# Patient Record
Sex: Female | Born: 1935 | Race: White | Hispanic: No | Smoking: Never smoker
Health system: Southern US, Community
[De-identification: ages and names within clinical notes are randomized; demographics above are authoritative.]

## PROBLEM LIST (undated history)

## (undated) DIAGNOSIS — I10 Essential (primary) hypertension: Secondary | ICD-10-CM

## (undated) DIAGNOSIS — M810 Age-related osteoporosis without current pathological fracture: Secondary | ICD-10-CM

## (undated) DIAGNOSIS — E039 Hypothyroidism, unspecified: Secondary | ICD-10-CM

## (undated) DIAGNOSIS — I4891 Unspecified atrial fibrillation: Secondary | ICD-10-CM

## (undated) HISTORY — PX: TOTAL HIP ARTHROPLASTY: SHX124

## (undated) HISTORY — DX: Hypothyroidism, unspecified: E03.9

## (undated) HISTORY — DX: Age-related osteoporosis without current pathological fracture: M81.0

## (undated) HISTORY — PX: TONSILLECTOMY: SUR1361

## (undated) HISTORY — PX: CATARACT EXTRACTION BILATERAL W/ ANTERIOR VITRECTOMY: SHX1304

---

## 2005-12-25 ENCOUNTER — Ambulatory Visit: Payer: Self-pay | Admitting: Family Medicine

## 2005-12-25 DIAGNOSIS — D510 Vitamin B12 deficiency anemia due to intrinsic factor deficiency: Secondary | ICD-10-CM | POA: Insufficient documentation

## 2006-01-19 ENCOUNTER — Ambulatory Visit: Payer: Self-pay | Admitting: Family Medicine

## 2006-01-19 DIAGNOSIS — I10 Essential (primary) hypertension: Secondary | ICD-10-CM

## 2006-01-19 DIAGNOSIS — M19019 Primary osteoarthritis, unspecified shoulder: Secondary | ICD-10-CM | POA: Insufficient documentation

## 2006-01-19 DIAGNOSIS — H1130 Conjunctival hemorrhage, unspecified eye: Secondary | ICD-10-CM | POA: Insufficient documentation

## 2007-08-23 ENCOUNTER — Ambulatory Visit: Payer: Self-pay | Admitting: Family Medicine

## 2007-08-23 DIAGNOSIS — M76899 Other specified enthesopathies of unspecified lower limb, excluding foot: Secondary | ICD-10-CM

## 2007-09-28 ENCOUNTER — Ambulatory Visit: Payer: Self-pay | Admitting: Family Medicine

## 2007-09-28 ENCOUNTER — Encounter: Admission: RE | Admit: 2007-09-28 | Discharge: 2007-09-28 | Payer: Self-pay | Admitting: Family Medicine

## 2007-09-28 DIAGNOSIS — M25539 Pain in unspecified wrist: Secondary | ICD-10-CM | POA: Insufficient documentation

## 2007-09-28 DIAGNOSIS — M79609 Pain in unspecified limb: Secondary | ICD-10-CM

## 2007-09-29 ENCOUNTER — Ambulatory Visit: Payer: Self-pay | Admitting: Family Medicine

## 2008-02-07 ENCOUNTER — Ambulatory Visit: Payer: Self-pay | Admitting: Family Medicine

## 2008-02-07 DIAGNOSIS — J019 Acute sinusitis, unspecified: Secondary | ICD-10-CM

## 2008-02-14 ENCOUNTER — Ambulatory Visit: Payer: Self-pay | Admitting: Family Medicine

## 2013-03-02 DIAGNOSIS — H53009 Unspecified amblyopia, unspecified eye: Secondary | ICD-10-CM | POA: Diagnosis not present

## 2013-03-02 DIAGNOSIS — H40019 Open angle with borderline findings, low risk, unspecified eye: Secondary | ICD-10-CM | POA: Diagnosis not present

## 2013-03-02 DIAGNOSIS — H4420 Degenerative myopia, unspecified eye: Secondary | ICD-10-CM | POA: Diagnosis not present

## 2013-03-02 DIAGNOSIS — H353190 Nonexudative age-related macular degeneration, unspecified eye, stage unspecified: Secondary | ICD-10-CM | POA: Diagnosis not present

## 2013-03-03 DIAGNOSIS — H4420 Degenerative myopia, unspecified eye: Secondary | ICD-10-CM | POA: Diagnosis not present

## 2013-03-03 DIAGNOSIS — H353190 Nonexudative age-related macular degeneration, unspecified eye, stage unspecified: Secondary | ICD-10-CM | POA: Diagnosis not present

## 2013-03-03 DIAGNOSIS — H53009 Unspecified amblyopia, unspecified eye: Secondary | ICD-10-CM | POA: Diagnosis not present

## 2013-03-03 DIAGNOSIS — H40019 Open angle with borderline findings, low risk, unspecified eye: Secondary | ICD-10-CM | POA: Diagnosis not present

## 2013-06-02 DIAGNOSIS — R609 Edema, unspecified: Secondary | ICD-10-CM | POA: Diagnosis not present

## 2013-06-02 DIAGNOSIS — I10 Essential (primary) hypertension: Secondary | ICD-10-CM | POA: Diagnosis not present

## 2013-06-02 DIAGNOSIS — I4891 Unspecified atrial fibrillation: Secondary | ICD-10-CM | POA: Diagnosis not present

## 2013-06-02 DIAGNOSIS — R269 Unspecified abnormalities of gait and mobility: Secondary | ICD-10-CM | POA: Diagnosis not present

## 2013-06-02 DIAGNOSIS — I2789 Other specified pulmonary heart diseases: Secondary | ICD-10-CM | POA: Diagnosis not present

## 2013-06-02 DIAGNOSIS — I872 Venous insufficiency (chronic) (peripheral): Secondary | ICD-10-CM | POA: Diagnosis not present

## 2013-06-14 DIAGNOSIS — H16229 Keratoconjunctivitis sicca, not specified as Sjogren's, unspecified eye: Secondary | ICD-10-CM | POA: Diagnosis not present

## 2013-06-14 DIAGNOSIS — H53419 Scotoma involving central area, unspecified eye: Secondary | ICD-10-CM | POA: Diagnosis not present

## 2013-06-14 DIAGNOSIS — Z961 Presence of intraocular lens: Secondary | ICD-10-CM | POA: Diagnosis not present

## 2013-06-24 DIAGNOSIS — H53419 Scotoma involving central area, unspecified eye: Secondary | ICD-10-CM | POA: Diagnosis not present

## 2013-06-24 DIAGNOSIS — H16229 Keratoconjunctivitis sicca, not specified as Sjogren's, unspecified eye: Secondary | ICD-10-CM | POA: Diagnosis not present

## 2013-07-15 DIAGNOSIS — I4891 Unspecified atrial fibrillation: Secondary | ICD-10-CM | POA: Diagnosis not present

## 2013-07-15 DIAGNOSIS — R21 Rash and other nonspecific skin eruption: Secondary | ICD-10-CM | POA: Diagnosis not present

## 2013-07-15 DIAGNOSIS — L989 Disorder of the skin and subcutaneous tissue, unspecified: Secondary | ICD-10-CM | POA: Diagnosis not present

## 2013-07-15 DIAGNOSIS — M79609 Pain in unspecified limb: Secondary | ICD-10-CM | POA: Diagnosis not present

## 2013-07-15 DIAGNOSIS — R079 Chest pain, unspecified: Secondary | ICD-10-CM | POA: Diagnosis not present

## 2013-07-21 DIAGNOSIS — M7989 Other specified soft tissue disorders: Secondary | ICD-10-CM | POA: Diagnosis not present

## 2013-07-28 DIAGNOSIS — R21 Rash and other nonspecific skin eruption: Secondary | ICD-10-CM | POA: Diagnosis not present

## 2013-07-28 DIAGNOSIS — R609 Edema, unspecified: Secondary | ICD-10-CM | POA: Diagnosis not present

## 2013-07-28 DIAGNOSIS — I872 Venous insufficiency (chronic) (peripheral): Secondary | ICD-10-CM | POA: Diagnosis not present

## 2013-07-28 DIAGNOSIS — B372 Candidiasis of skin and nail: Secondary | ICD-10-CM | POA: Diagnosis not present

## 2013-07-28 DIAGNOSIS — E0789 Other specified disorders of thyroid: Secondary | ICD-10-CM | POA: Diagnosis not present

## 2013-07-28 DIAGNOSIS — E038 Other specified hypothyroidism: Secondary | ICD-10-CM | POA: Diagnosis not present

## 2013-08-02 DIAGNOSIS — L578 Other skin changes due to chronic exposure to nonionizing radiation: Secondary | ICD-10-CM | POA: Diagnosis not present

## 2013-08-02 DIAGNOSIS — L2089 Other atopic dermatitis: Secondary | ICD-10-CM | POA: Diagnosis not present

## 2013-08-02 DIAGNOSIS — L259 Unspecified contact dermatitis, unspecified cause: Secondary | ICD-10-CM | POA: Diagnosis not present

## 2013-08-16 DIAGNOSIS — L259 Unspecified contact dermatitis, unspecified cause: Secondary | ICD-10-CM | POA: Diagnosis not present

## 2013-08-16 DIAGNOSIS — D649 Anemia, unspecified: Secondary | ICD-10-CM | POA: Diagnosis not present

## 2013-08-16 DIAGNOSIS — L578 Other skin changes due to chronic exposure to nonionizing radiation: Secondary | ICD-10-CM | POA: Diagnosis not present

## 2013-08-16 DIAGNOSIS — L2089 Other atopic dermatitis: Secondary | ICD-10-CM | POA: Diagnosis not present

## 2013-11-08 DIAGNOSIS — Z23 Encounter for immunization: Secondary | ICD-10-CM | POA: Diagnosis not present

## 2013-11-11 DIAGNOSIS — H3531 Nonexudative age-related macular degeneration: Secondary | ICD-10-CM | POA: Diagnosis not present

## 2013-11-21 DIAGNOSIS — I272 Other secondary pulmonary hypertension: Secondary | ICD-10-CM | POA: Diagnosis not present

## 2013-11-21 DIAGNOSIS — I10 Essential (primary) hypertension: Secondary | ICD-10-CM | POA: Diagnosis not present

## 2013-11-21 DIAGNOSIS — I872 Venous insufficiency (chronic) (peripheral): Secondary | ICD-10-CM | POA: Diagnosis not present

## 2013-11-21 DIAGNOSIS — I480 Paroxysmal atrial fibrillation: Secondary | ICD-10-CM | POA: Diagnosis not present

## 2013-11-21 DIAGNOSIS — R269 Unspecified abnormalities of gait and mobility: Secondary | ICD-10-CM | POA: Diagnosis not present

## 2013-11-21 DIAGNOSIS — R600 Localized edema: Secondary | ICD-10-CM | POA: Diagnosis not present

## 2013-11-21 DIAGNOSIS — Z23 Encounter for immunization: Secondary | ICD-10-CM | POA: Diagnosis not present

## 2013-11-28 DIAGNOSIS — I480 Paroxysmal atrial fibrillation: Secondary | ICD-10-CM | POA: Diagnosis not present

## 2013-11-28 DIAGNOSIS — R600 Localized edema: Secondary | ICD-10-CM | POA: Diagnosis not present

## 2013-11-28 DIAGNOSIS — I272 Other secondary pulmonary hypertension: Secondary | ICD-10-CM | POA: Diagnosis not present

## 2013-11-28 DIAGNOSIS — I872 Venous insufficiency (chronic) (peripheral): Secondary | ICD-10-CM | POA: Diagnosis not present

## 2013-11-28 DIAGNOSIS — I10 Essential (primary) hypertension: Secondary | ICD-10-CM | POA: Diagnosis not present

## 2013-12-29 DIAGNOSIS — D510 Vitamin B12 deficiency anemia due to intrinsic factor deficiency: Secondary | ICD-10-CM | POA: Diagnosis not present

## 2013-12-29 DIAGNOSIS — Z0000 Encounter for general adult medical examination without abnormal findings: Secondary | ICD-10-CM | POA: Diagnosis not present

## 2013-12-29 DIAGNOSIS — I10 Essential (primary) hypertension: Secondary | ICD-10-CM | POA: Diagnosis not present

## 2013-12-29 DIAGNOSIS — E538 Deficiency of other specified B group vitamins: Secondary | ICD-10-CM | POA: Diagnosis not present

## 2014-06-07 DIAGNOSIS — I10 Essential (primary) hypertension: Secondary | ICD-10-CM | POA: Diagnosis not present

## 2014-06-07 DIAGNOSIS — R269 Unspecified abnormalities of gait and mobility: Secondary | ICD-10-CM | POA: Diagnosis not present

## 2014-06-07 DIAGNOSIS — I872 Venous insufficiency (chronic) (peripheral): Secondary | ICD-10-CM | POA: Diagnosis not present

## 2014-06-07 DIAGNOSIS — I480 Paroxysmal atrial fibrillation: Secondary | ICD-10-CM | POA: Diagnosis not present

## 2014-06-14 DIAGNOSIS — I872 Venous insufficiency (chronic) (peripheral): Secondary | ICD-10-CM | POA: Diagnosis not present

## 2014-06-14 DIAGNOSIS — R269 Unspecified abnormalities of gait and mobility: Secondary | ICD-10-CM | POA: Diagnosis not present

## 2014-06-14 DIAGNOSIS — I480 Paroxysmal atrial fibrillation: Secondary | ICD-10-CM | POA: Diagnosis not present

## 2014-06-14 DIAGNOSIS — I10 Essential (primary) hypertension: Secondary | ICD-10-CM | POA: Diagnosis not present

## 2014-06-14 DIAGNOSIS — I272 Other secondary pulmonary hypertension: Secondary | ICD-10-CM | POA: Diagnosis not present

## 2014-06-29 DIAGNOSIS — S838X1A Sprain of other specified parts of right knee, initial encounter: Secondary | ICD-10-CM | POA: Diagnosis not present

## 2014-06-29 DIAGNOSIS — H53413 Scotoma involving central area, bilateral: Secondary | ICD-10-CM | POA: Diagnosis not present

## 2014-06-29 DIAGNOSIS — H3531 Nonexudative age-related macular degeneration: Secondary | ICD-10-CM | POA: Diagnosis not present

## 2014-07-27 DIAGNOSIS — M25561 Pain in right knee: Secondary | ICD-10-CM | POA: Diagnosis not present

## 2014-07-27 DIAGNOSIS — E538 Deficiency of other specified B group vitamins: Secondary | ICD-10-CM | POA: Diagnosis not present

## 2014-08-31 DIAGNOSIS — M1711 Unilateral primary osteoarthritis, right knee: Secondary | ICD-10-CM | POA: Diagnosis not present

## 2014-08-31 DIAGNOSIS — R2241 Localized swelling, mass and lump, right lower limb: Secondary | ICD-10-CM | POA: Diagnosis not present

## 2014-09-13 DIAGNOSIS — I480 Paroxysmal atrial fibrillation: Secondary | ICD-10-CM | POA: Diagnosis not present

## 2014-09-13 DIAGNOSIS — I872 Venous insufficiency (chronic) (peripheral): Secondary | ICD-10-CM | POA: Diagnosis not present

## 2014-09-13 DIAGNOSIS — I272 Other secondary pulmonary hypertension: Secondary | ICD-10-CM | POA: Diagnosis not present

## 2014-09-13 DIAGNOSIS — I10 Essential (primary) hypertension: Secondary | ICD-10-CM | POA: Diagnosis not present

## 2014-09-13 DIAGNOSIS — R269 Unspecified abnormalities of gait and mobility: Secondary | ICD-10-CM | POA: Diagnosis not present

## 2014-10-05 DIAGNOSIS — M1711 Unilateral primary osteoarthritis, right knee: Secondary | ICD-10-CM | POA: Diagnosis not present

## 2014-10-09 DIAGNOSIS — E039 Hypothyroidism, unspecified: Secondary | ICD-10-CM | POA: Diagnosis not present

## 2014-10-09 DIAGNOSIS — Z23 Encounter for immunization: Secondary | ICD-10-CM | POA: Diagnosis not present

## 2014-10-09 DIAGNOSIS — D510 Vitamin B12 deficiency anemia due to intrinsic factor deficiency: Secondary | ICD-10-CM | POA: Diagnosis not present

## 2014-10-09 DIAGNOSIS — H6123 Impacted cerumen, bilateral: Secondary | ICD-10-CM | POA: Diagnosis not present

## 2014-10-09 DIAGNOSIS — E782 Mixed hyperlipidemia: Secondary | ICD-10-CM | POA: Diagnosis not present

## 2014-10-09 DIAGNOSIS — I10 Essential (primary) hypertension: Secondary | ICD-10-CM | POA: Diagnosis not present

## 2014-10-09 DIAGNOSIS — D508 Other iron deficiency anemias: Secondary | ICD-10-CM | POA: Diagnosis not present

## 2014-10-09 DIAGNOSIS — I482 Chronic atrial fibrillation: Secondary | ICD-10-CM | POA: Diagnosis not present

## 2014-10-31 DIAGNOSIS — M1711 Unilateral primary osteoarthritis, right knee: Secondary | ICD-10-CM | POA: Diagnosis not present

## 2014-11-23 ENCOUNTER — Emergency Department (INDEPENDENT_AMBULATORY_CARE_PROVIDER_SITE_OTHER)
Admission: EM | Admit: 2014-11-23 | Discharge: 2014-11-23 | Disposition: A | Discharge status: Home / Self Care | Payer: Medicare Other | Source: Home / Self Care | Attending: Family Medicine | Admitting: Family Medicine

## 2014-11-23 ENCOUNTER — Encounter: Payer: Self-pay | Admitting: *Deleted

## 2014-11-23 DIAGNOSIS — Z23 Encounter for immunization: Secondary | ICD-10-CM | POA: Diagnosis not present

## 2014-11-23 DIAGNOSIS — W1839XA Other fall on same level, initial encounter: Secondary | ICD-10-CM | POA: Diagnosis not present

## 2014-11-23 DIAGNOSIS — S41111A Laceration without foreign body of right upper arm, initial encounter: Secondary | ICD-10-CM

## 2014-11-23 DIAGNOSIS — W010XXA Fall on same level from slipping, tripping and stumbling without subsequent striking against object, initial encounter: Secondary | ICD-10-CM

## 2014-11-23 HISTORY — DX: Essential (primary) hypertension: I10

## 2014-11-23 HISTORY — DX: Unspecified atrial fibrillation: I48.91

## 2014-11-23 MED ORDER — INFLUENZA VAC SPLIT QUAD 0.5 ML IM SUSY
0.5000 mL | PREFILLED_SYRINGE | Freq: Once | INTRAMUSCULAR | Status: AC
  Administered 2014-11-23: 0.5 mL via INTRAMUSCULAR

## 2014-11-23 MED ORDER — TETANUS-DIPHTH-ACELL PERTUSSIS 5-2.5-18.5 LF-MCG/0.5 IM SUSP
0.5000 mL | Freq: Once | INTRAMUSCULAR | Status: AC
  Administered 2014-11-23: 0.5 mL via INTRAMUSCULAR

## 2014-11-23 NOTE — ED Provider Notes (Signed)
CSN: [id number]     Arrival date & time 11/23/14  1234 History   First MD Initiated Contact with Patient 11/23/14 1251     Chief Complaint  Patient presents with  . Extremity Laceration   (Consider location/radiation/quality/duration/timing/severity/associated sxs/prior Treatment) HPI  Pt is an 81yo female presenting to [facility] with a laceration to her Right forearm that occurred after she tripped and fell at home yesterday morning, hitting it on a wooden chair.  Pt states she normally uses a cane or walker but not for just short distances.  Denies hitting her head or LOC.  Pt reports bleeding resolved with use of light pressure. Pt reports minimal aching pain.  She cleaned the wound at home and applied Neosporin.  Denies any other injuries. She is not on blood thinners but does take 2 "baby aspirin" daily. Pt is accompanied by her friend today who noticed how deep the cut was and recommended she be evaluated today.  Pt is new to the area and has her first appointment with Dr. Madilyn Fireman on 12/04/14.  Past Medical History  Diagnosis Date  . Hypertension   . Atrial fibrillation [facility])    Past Surgical History  Procedure Laterality Date  . Total hip arthroplasty     Family History  Problem Relation Age of Onset  . Stroke Mother   . Heart attack Father    Social History  Substance Use Topics  . Smoking status: Never Smoker   . Smokeless tobacco: Never Used  . Alcohol Use: No   OB History    No data available     Review of Systems  Cardiovascular: Negative for chest pain and palpitations.  Musculoskeletal: Negative for myalgias and back pain.  Skin: Positive for wound. Negative for color change.  Neurological: Negative for dizziness, seizures, syncope, weakness, light-headedness and numbness.    Allergies  Review of patient's allergies indicates no known allergies.  Home Medications   Prior to Admission medications   Medication Sig Start Date End Date Taking? Authorizing  Provider  aspirin 81 MG tablet Take 162 mg by mouth daily.   Yes Historical Provider, MD  diltiazem (TIAZAC) 240 MG 24 hr capsule Take 240 mg by mouth daily.   Yes Historical Provider, MD  diphenoxylate-atropine (LOMOTIL) 2.5-0.025 MG tablet Take by mouth 4 (four) times daily as needed for diarrhea or loose stools.   Yes Historical Provider, MD  furosemide (LASIX) 20 MG tablet Take 20 mg by mouth 2 (two) times daily.   Yes Historical Provider, MD  gabapentin (NEURONTIN) 600 MG tablet Take 600 mg by mouth 3 (three) times daily.   Yes Historical Provider, MD  levothyroxine (SYNTHROID, LEVOTHROID) 137 MCG tablet Take 137 mcg by mouth daily before breakfast.   Yes Historical Provider, MD  Levothyroxine Sodium (L-THYROXINE SODIUM) POWD by Does not apply route.   Yes Historical Provider, MD  losartan (COZAAR) 100 MG tablet Take 100 mg by mouth daily.   Yes Historical Provider, MD  metoprolol (LOPRESSOR) 100 MG tablet Take 100 mg by mouth 2 (two) times daily.   Yes Historical Provider, MD  Multiple Vitamin (MULTIVITAMIN) tablet Take 1 tablet by mouth daily.   Yes Historical Provider, MD  Omega-3 Fatty Acids (FISH OIL) 1200 MG CAPS Take 2,400 mg by mouth.   Yes Historical Provider, MD   Meds Ordered and Administered this Visit   Medications  Influenza vac split quadrivalent PF (FLUARIX) injection 0.5 mL (0.5 mLs Intramuscular Given 11/23/14 1326)  Tdap (BOOSTRIX) injection 0.5 mL (  0.5 mLs Intramuscular Given 11/23/14 1327)    BP 101/65 mmHg  Pulse 68  Temp(Src) 98.1 F (36.7 C) (Oral)  Resp 14  Wt 147 lb (66.679 kg)  SpO2 97% No data found.   Physical Exam  Constitutional: She is oriented to person, place, and time. She appears well-developed and well-nourished.  HENT:  Head: Normocephalic and atraumatic.  Eyes: EOM are normal.  Neck: Normal range of motion.  Cardiovascular: Normal rate.   Pulses:      Radial pulses are 2+ on the right side.  Pulmonary/Chest: Effort normal.    Musculoskeletal: Normal range of motion.  Neurological: She is alert and oriented to person, place, and time.  Right arm: normal sensation  Skin: Skin is warm and dry. Laceration noted.     Psychiatric: She has a normal mood and affect. Her behavior is normal.  Nursing note and vitals reviewed.   ED Course  .Marland KitchenLaceration Repair Date/Time: 11/23/2014 1:36 PM Performed by: Noland Fordyce Authorized by: Theone Murdoch A Consent: Verbal consent obtained. Risks and benefits: risks, benefits and alternatives were discussed Consent given by: patient Patient understanding: patient states understanding of the procedure being performed Site marked: the operative site was marked Required items: required blood products, implants, devices, and special equipment available Patient identity confirmed: verbally with patient Body area: upper extremity Location details: right lower arm Laceration length: 3 cm Foreign bodies: no foreign bodies Tendon involvement: none Nerve involvement: none Vascular damage: no Local anesthetic: lidocaine 2% with epinephrine Anesthetic total: 1 ml Patient sedated: no Preparation: Patient was prepped and draped in the usual sterile fashion. Irrigation solution: saline Irrigation method: syringe Amount of cleaning: standard Debridement: none Degree of undermining: none Skin closure: 4-0 Prolene Number of sutures: 3 Technique: simple Approximation: loose Approximation difficulty: simple Dressing: 4x4 sterile gauze Patient tolerance: Patient tolerated the procedure well with no immediate complications   (including critical care time)  Labs Review Labs Reviewed - No data to display  Imaging Review No results found.     MDM   1. Fall from slip, trip, or stumble, initial encounter   2. Arm laceration, right, initial encounter     Pt presenting to [facility] with laceration to Right forearm following a trip and fall at home yesterday. No other injuries.   Due to depth and size of laceration, suture closer indicated, however, due to age of laceration, it was sutured loosely together with 3 sutures. See above. Pt updated on her Tdap today as well as given her flu vaccine. Pt requested prescription for a walker as she just moved here from [place] and does not have f/u with PCP until 12/04/14.  Hand written prescription for walker provided. Home care instructions for sutured wound care provided. F/u in 10-14 days for suture removal. Pt and her friend verbalized understanding and agreement with tx plan.      Noland Fordyce, PA-C 11/23/14 1351

## 2014-11-23 NOTE — ED Notes (Signed)
Laceration to right forearm yesterday from falling and hitting on a wooden chair. She believes she lost her balance, not using her cane and fell. Cleaned with neosporin @ home. Needs TDaP.

## 2014-12-04 ENCOUNTER — Ambulatory Visit (INDEPENDENT_AMBULATORY_CARE_PROVIDER_SITE_OTHER): Payer: Medicare Other | Admitting: Family Medicine

## 2014-12-04 ENCOUNTER — Other Ambulatory Visit: Payer: Self-pay | Admitting: Family Medicine

## 2014-12-04 ENCOUNTER — Encounter: Payer: Self-pay | Admitting: Family Medicine

## 2014-12-04 VITALS — BP 113/59 | HR 76 | Temp 97.7°F | Wt 148.0 lb

## 2014-12-04 DIAGNOSIS — S51811A Laceration without foreign body of right forearm, initial encounter: Secondary | ICD-10-CM

## 2014-12-04 DIAGNOSIS — M170 Bilateral primary osteoarthritis of knee: Secondary | ICD-10-CM | POA: Insufficient documentation

## 2014-12-04 DIAGNOSIS — K047 Periapical abscess without sinus: Secondary | ICD-10-CM | POA: Diagnosis not present

## 2014-12-04 DIAGNOSIS — I4891 Unspecified atrial fibrillation: Secondary | ICD-10-CM | POA: Diagnosis not present

## 2014-12-04 DIAGNOSIS — D510 Vitamin B12 deficiency anemia due to intrinsic factor deficiency: Secondary | ICD-10-CM | POA: Diagnosis not present

## 2014-12-04 DIAGNOSIS — I10 Essential (primary) hypertension: Secondary | ICD-10-CM

## 2014-12-04 DIAGNOSIS — M1711 Unilateral primary osteoarthritis, right knee: Secondary | ICD-10-CM

## 2014-12-04 DIAGNOSIS — R220 Localized swelling, mass and lump, head: Secondary | ICD-10-CM

## 2014-12-04 MED ORDER — AMOXICILLIN-POT CLAVULANATE 875-125 MG PO TABS: 1.0000 | ORAL_TABLET | Freq: Two times a day (BID) | ORAL | Status: DC

## 2014-12-04 MED ORDER — CYANOCOBALAMIN 1000 MCG/ML IJ SOLN
1000.0000 ug | INTRAMUSCULAR | Status: DC
  Administered 2014-12-04: 1000 ug via INTRAMUSCULAR

## 2014-12-04 NOTE — Progress Notes (Signed)
Subjective:    Patient ID: Tracie Nguyen, female    DOB: [birth year], 81 y.o.   MRN: [id number]  HPI [place] and hit her forearm on 11/23/14.  She reports she falls a lot. Tdap was updated at the [facility].   She is healing well with no complications. She uses a Insurance claims handler.  Sutures need to be removed.    Right jaw and and right lip pain. RAdiates into the ear.  Hard to chew.  Says it is painful but feels numb to the touch.  Still has a few teeth.  Says doesn' tfeel like it is her teeth.   Has a dentist app in about 3 weeks.  No fever, chills, etc.   Right knee pain with OA.  She had knee "shot" a couple of months ago. She was supposed to get 3.  Wants to get these scheduled.    Afib - no swelling or SOB recently. Though family member says she does get winded with activity.  On ASA.    Neuropathy - she is on gabapentin. Works well. She is not sure about the cause of her neuropathy.    Review of Systems  Constitutional: Negative for fever, diaphoresis and unexpected weight change.  HENT: Negative for hearing loss, rhinorrhea and tinnitus.   Eyes: Negative for visual disturbance.  Respiratory: Negative for cough and wheezing.   Cardiovascular: Negative for chest pain and palpitations.  Gastrointestinal: Negative for nausea, vomiting, diarrhea and blood in stool.  Genitourinary: Negative for vaginal bleeding, vaginal discharge and difficulty urinating.  Musculoskeletal: Negative for myalgias and arthralgias.  Skin: Negative for rash.  Neurological: Negative for headaches.  Hematological: Negative for adenopathy. Does not bruise/bleed easily.  Psychiatric/Behavioral: Negative for sleep disturbance and dysphoric mood. The patient is not nervous/anxious.      BP 113/59 mmHg  Pulse 76  Temp(Src) 97.7 F (36.5 C)  Wt 148 lb (67.132 kg)  SpO2 99%    No Known Allergies  Past Medical History  Diagnosis Date  . Hypertension   . Atrial fibrillation [facility])     Past Surgical History   Procedure Laterality Date  . Total hip arthroplasty      Social History   Social History  . Marital Status: Widowed    Spouse Name: N/A  . Number of Children: N/A  . Years of Education: N/A   Occupational History  . Not on file.   Social History Main Topics  . Smoking status: Never Smoker   . Smokeless tobacco: Never Used  . Alcohol Use: No  . Drug Use: No  . Sexual Activity: Not on file   Other Topics Concern  . Not on file   Social History Narrative    Family History  Problem Relation Age of Onset  . Stroke Mother   . Heart attack Father     Outpatient Encounter Prescriptions as of 12/04/2014  Medication Sig  . amoxicillin-clavulanate (AUGMENTIN) 875-125 MG tablet Take 1 tablet by mouth 2 (two) times daily.  Marland Kitchen aspirin 81 MG tablet Take 162 mg by mouth daily.  Marland Kitchen diltiazem (TIAZAC) 240 MG 24 hr capsule Take 240 mg by mouth daily. Take 1/2 tablet daily  . diphenoxylate-atropine (LOMOTIL) 2.5-0.025 MG tablet Take by mouth 4 (four) times daily as needed for diarrhea or loose stools.  . furosemide (LASIX) 20 MG tablet Take 20 mg by mouth 2 (two) times daily.  Marland Kitchen gabapentin (NEURONTIN) 600 MG tablet Take 600 mg by mouth 3 (three) times daily.  Marland Kitchen  levothyroxine (SYNTHROID, LEVOTHROID) 137 MCG tablet Take 137 mcg by mouth daily before breakfast.  . losartan (COZAAR) 100 MG tablet Take 100 mg by mouth daily.  . metoprolol (LOPRESSOR) 100 MG tablet Take 100 mg by mouth 2 (two) times daily.  . Multiple Vitamin (MULTIVITAMIN) tablet Take 2 tablets by mouth daily.   . Omega-3 Fatty Acids (FISH OIL) 1200 MG CAPS Take 2,400 mg by mouth.  . [DISCONTINUED] Levothyroxine Sodium (L-THYROXINE SODIUM) POWD by Does not apply route.   Facility-Administered Encounter Medications as of 12/04/2014  Medication  . cyanocobalamin ((VITAMIN B-12)) injection 1,000 mcg          Objective:   Physical Exam  Constitutional: She is oriented to person, place, and time. She appears  well-developed and well-nourished.  HENT:  Head: Normocephalic and atraumatic.  Mouth/Throat: Oropharynx is clear and moist.  Tender along the right jaw line.  Lower gum lin on the right lower jaw is very tender and there is some pus around the molar that is present on the right lower jaw line.  Some mild facial swelling over the cheek on the right.   Eyes: Conjunctivae and EOM are normal. Pupils are equal, round, and reactive to light.  Neck: Neck supple. No thyromegaly present.  Small swollen approx 0.5 cm submandibular LN. Tender to palpation.   Cardiovascular: Normal rate and normal heart sounds.   + irregular rhythm.    Pulmonary/Chest: Effort normal and breath sounds normal.  Lymphadenopathy:    She has cervical adenopathy.  Neurological: She is alert and oriented to person, place, and time.  Skin: Skin is warm and dry.  Psychiatric: She has a normal mood and affect. Her behavior is normal.          Assessment & Plan:  Dental abscess - Discussed dx. Will start Augment. appt is in 2 weeks but encouraged her to call to get in sooner. Call if pain worsens, swelling worsens or develops fever, chills etc.  Right knee OA - will refer to sport med for possible Synvisc injection.  Not sure exactly which series she started.    Laceration to right forearm - sutures removed easily.  Patient tolerated well. Healing well.    Afib- asymptomatic.  On ASA.     LE edema - On lasix daily. Will check BMP.

## 2014-12-05 ENCOUNTER — Encounter: Payer: Self-pay | Admitting: Family Medicine

## 2014-12-05 LAB — COMPLETE METABOLIC PANEL WITH GFR
ALT: 12 U/L (ref 6–29)
AST: 15 U/L (ref 10–35)
Albumin: 3.9 g/dL (ref 3.6–5.1)
Alkaline Phosphatase: 63 U/L (ref 33–130)
BUN: 41 mg/dL — AB (ref 7–25)
CALCIUM: 9.4 mg/dL (ref 8.6–10.4)
CHLORIDE: 98 mmol/L (ref 98–110)
CO2: 26 mmol/L (ref 20–31)
CREATININE: 1.74 mg/dL — AB (ref 0.60–0.88)
GFR, Est African American: 30 mL/min — ABNORMAL LOW (ref >=60)
GFR, Est Non African American: 26 mL/min — ABNORMAL LOW (ref >=60)
Glucose, Bld: 95 mg/dL (ref 65–99)
POTASSIUM: 4.3 mmol/L (ref 3.5–5.3)
Sodium: 140 mmol/L (ref 135–146)
Total Bilirubin: 0.6 mg/dL (ref 0.2–1.2)
Total Protein: 7.4 g/dL (ref 6.1–8.1)

## 2014-12-05 LAB — CBC WITH DIFFERENTIAL/PLATELET
BASOS ABS: 0 10*3/uL (ref 0.0–0.1)
Basophils Relative: 0 % (ref 0–1)
Eosinophils Absolute: 0.4 10*3/uL (ref 0.0–0.7)
Eosinophils Relative: 3 % (ref 0–5)
HEMATOCRIT: 35 % — AB (ref 36.0–46.0)
HEMOGLOBIN: 11.3 g/dL — AB (ref 12.0–15.0)
LYMPHS ABS: 2.9 10*3/uL (ref 0.7–4.0)
LYMPHS PCT: 20 % (ref 12–46)
MCH: 28.1 pg (ref 26.0–34.0)
MCHC: 32.3 g/dL (ref 30.0–36.0)
MCV: 87.1 fL (ref 78.0–100.0)
MONO ABS: 1.5 10*3/uL — AB (ref 0.1–1.0)
MPV: 10.7 fL (ref 8.6–12.4)
Monocytes Relative: 10 % (ref 3–12)
NEUTROS ABS: 9.7 10*3/uL — AB (ref 1.7–7.7)
Neutrophils Relative %: 67 % (ref 43–77)
Platelets: 357 10*3/uL (ref 150–400)
RBC: 4.02 MIL/uL (ref 3.87–5.11)
RDW: 16.3 % — ABNORMAL HIGH (ref 11.5–15.5)
WBC: 14.5 10*3/uL — AB (ref 4.0–10.5)

## 2014-12-06 ENCOUNTER — Other Ambulatory Visit: Payer: Self-pay | Admitting: *Deleted

## 2014-12-06 DIAGNOSIS — R7989 Other specified abnormal findings of blood chemistry: Secondary | ICD-10-CM

## 2014-12-06 LAB — FERRITIN: Ferritin: 106 ng/mL (ref 10–291)

## 2014-12-16 ENCOUNTER — Encounter: Payer: Self-pay | Admitting: Family Medicine

## 2014-12-18 ENCOUNTER — Other Ambulatory Visit: Payer: Self-pay

## 2014-12-18 ENCOUNTER — Encounter: Payer: Self-pay | Admitting: Family Medicine

## 2014-12-18 DIAGNOSIS — E785 Hyperlipidemia, unspecified: Secondary | ICD-10-CM | POA: Insufficient documentation

## 2014-12-18 MED ORDER — LEVOTHYROXINE SODIUM 137 MCG PO TABS: 137.0000 ug | ORAL_TABLET | Freq: Every day | ORAL | Status: DC

## 2014-12-18 MED ORDER — FUROSEMIDE 20 MG PO TABS: 20.0000 mg | ORAL_TABLET | Freq: Two times a day (BID) | ORAL | Status: DC

## 2014-12-18 MED ORDER — LOSARTAN POTASSIUM 100 MG PO TABS: 100.0000 mg | ORAL_TABLET | Freq: Every day | ORAL | Status: DC

## 2014-12-21 ENCOUNTER — Telehealth: Payer: Self-pay | Admitting: *Deleted

## 2014-12-21 DIAGNOSIS — R748 Abnormal levels of other serum enzymes: Secondary | ICD-10-CM | POA: Diagnosis not present

## 2014-12-21 DIAGNOSIS — R7989 Other specified abnormal findings of blood chemistry: Secondary | ICD-10-CM | POA: Diagnosis not present

## 2014-12-21 NOTE — Telephone Encounter (Signed)
lvm informing pt's daughter that form is up front for p/u.Audelia [place]

## 2014-12-22 ENCOUNTER — Encounter: Payer: Self-pay | Admitting: Family Medicine

## 2014-12-22 DIAGNOSIS — N183 Chronic kidney disease, stage 3 unspecified: Secondary | ICD-10-CM | POA: Insufficient documentation

## 2014-12-22 LAB — BASIC METABOLIC PANEL WITH GFR
BUN: 31 mg/dL — AB (ref 7–25)
CHLORIDE: 106 mmol/L (ref 98–110)
CO2: 25 mmol/L (ref 20–31)
CREATININE: 1.33 mg/dL — AB (ref 0.60–0.88)
Calcium: 9.2 mg/dL (ref 8.6–10.4)
GFR, Est African American: 41 mL/min — ABNORMAL LOW (ref >=60)
GFR, Est Non African American: 36 mL/min — ABNORMAL LOW (ref >=60)
GLUCOSE: 95 mg/dL (ref 65–99)
POTASSIUM: 5 mmol/L (ref 3.5–5.3)
Sodium: 139 mmol/L (ref 135–146)

## 2015-01-03 ENCOUNTER — Encounter: Payer: Self-pay | Admitting: Family Medicine

## 2015-01-03 ENCOUNTER — Ambulatory Visit (INDEPENDENT_AMBULATORY_CARE_PROVIDER_SITE_OTHER): Payer: Medicare Other | Admitting: Family Medicine

## 2015-01-03 VITALS — BP 144/82 | HR 79 | Temp 97.6°F | Resp 18 | Wt 148.9 lb

## 2015-01-03 DIAGNOSIS — E039 Hypothyroidism, unspecified: Secondary | ICD-10-CM

## 2015-01-03 DIAGNOSIS — H5440 Blindness, one eye, unspecified eye: Secondary | ICD-10-CM | POA: Insufficient documentation

## 2015-01-03 DIAGNOSIS — I10 Essential (primary) hypertension: Secondary | ICD-10-CM

## 2015-01-03 DIAGNOSIS — H5442 Blindness, left eye, normal vision right eye: Secondary | ICD-10-CM

## 2015-01-03 DIAGNOSIS — Z0000 Encounter for general adult medical examination without abnormal findings: Secondary | ICD-10-CM | POA: Diagnosis not present

## 2015-01-03 DIAGNOSIS — N183 Chronic kidney disease, stage 3 unspecified: Secondary | ICD-10-CM

## 2015-01-03 DIAGNOSIS — G609 Hereditary and idiopathic neuropathy, unspecified: Secondary | ICD-10-CM

## 2015-01-03 MED ORDER — FUROSEMIDE 20 MG PO TABS: 20.0000 mg | ORAL_TABLET | ORAL | Status: DC

## 2015-01-03 NOTE — Addendum Note (Signed)
Addended by: Beatrice Lecher D on: 01/03/2015 01:30 PM   Modules accepted: Orders, Medications

## 2015-01-03 NOTE — Progress Notes (Deleted)
Subjective:    Patient ID: Tracie Nguyen, female    DOB: Apr 05, 1925, 81 y.o.   MRN: [id number]  HPI Tracie Nguyen is here today for surgical clearance for dental surgery. She is having surgical removal of teeth for 316, 19, 21-27, 30, 32 and removal of the tori. She is scheduled for surgery on December 21. She has a history of atrial fibrillation, hypothyroidism, hypertension and chronic kidney disease. She is only on aspirin for her atrial fibrillation and takes metoprolol for rate control.  No problems with anesthesia in the past.   No CP or SOB.   She is able to walk about 100 feet before stopping to rest.   No swallowing problems.    Review of Systems   BP 144/82 mmHg  Pulse 79  Temp(Src) 97.6 F (36.4 C)  Resp 18  Wt 148 lb 14.4 oz (67.541 kg)  SpO2 97%    No Known Allergies  Past Medical History  Diagnosis Date  . Hypertension   . Atrial fibrillation [facility])     Past Surgical History  Procedure Laterality Date  . Total hip arthroplasty      Social History   Social History  . Marital Status: Widowed    Spouse Name: N/A  . Number of Children: N/A  . Years of Education: N/A   Occupational History  . Not on file.   Social History Main Topics  . Smoking status: Never Smoker   . Smokeless tobacco: Never Used  . Alcohol Use: No  . Drug Use: No  . Sexual Activity: Not on file   Other Topics Concern  . Not on file   Social History Narrative   Moved here form [place], [place] to be close to family     Family History  Problem Relation Age of Onset  . Stroke Mother   . Heart attack Father     Outpatient Encounter Prescriptions as of 01/03/2015  Medication Sig  . aspirin 81 MG tablet Take 162 mg by mouth daily.  Marland Kitchen diltiazem (TIAZAC) 240 MG 24 hr capsule Take 240 mg by mouth daily. Take 1/2 tablet daily  . diphenoxylate-atropine (LOMOTIL) 2.5-0.025 MG tablet Take by mouth 4 (four) times daily as needed for diarrhea or loose stools.  . furosemide (LASIX) 20  MG tablet Take 1 tablet (20 mg total) by mouth 2 (two) times daily.  Marland Kitchen gabapentin (NEURONTIN) 600 MG tablet Take 600 mg by mouth 3 (three) times daily.  Marland Kitchen levothyroxine (SYNTHROID, LEVOTHROID) 137 MCG tablet Take 1 tablet (137 mcg total) by mouth daily before breakfast.  . losartan (COZAAR) 100 MG tablet Take 1 tablet (100 mg total) by mouth daily.  . metoprolol (LOPRESSOR) 100 MG tablet Take 100 mg by mouth 2 (two) times daily.  . Multiple Vitamin (MULTIVITAMIN) tablet Take 2 tablets by mouth daily.   . Omega-3 Fatty Acids (FISH OIL) 1200 MG CAPS Take 2,400 mg by mouth.  . [DISCONTINUED] amoxicillin-clavulanate (AUGMENTIN) 875-125 MG tablet Take 1 tablet by mouth 2 (two) times daily.   No facility-administered encounter medications on file as of 01/03/2015.           Objective:   Physical Exam        Assessment & Plan:    Subjective:    Tracie Nguyen is a 81 y.o. female who presents for Medicare Annual/Subsequent preventive examination.  Preventive Screening-Counseling & Management  Tobacco History  Smoking status  . Never Smoker   Smokeless tobacco  . Never Used  Problems Prior to Visit 1.   Current Problems (verified) Patient Active Problem List   Diagnosis Date Noted  . Blindness of left eye 01/03/2015  . CKD (chronic kidney disease) stage 3, GFR 30-59 ml/min 12/22/2014  . Hyperlipidemia 12/18/2014  . Primary osteoarthritis of right knee 12/04/2014  . Atrial fibrillation ([place]) 12/04/2014  . TROCHANTERIC BURSITIS, LEFT 08/23/2007  . HYPERTENSION, BENIGN ESSENTIAL 01/19/2006  . ARTHROPATHY NOS, SHOULDER 01/19/2006  . PERNICIOUS ANEMIA 12/25/2005    Medications Prior to Visit Current Outpatient Prescriptions on File Prior to Visit  Medication Sig Dispense Refill  . aspirin 81 MG tablet Take 162 mg by mouth daily.    Marland Kitchen diltiazem (TIAZAC) 240 MG 24 hr capsule Take 240 mg by mouth daily. Take 1/2 tablet daily    . diphenoxylate-atropine (LOMOTIL)  2.5-0.025 MG tablet Take by mouth 4 (four) times daily as needed for diarrhea or loose stools.    . furosemide (LASIX) 20 MG tablet Take 1 tablet (20 mg total) by mouth 2 (two) times daily. 90 tablet 0  . gabapentin (NEURONTIN) 600 MG tablet Take 600 mg by mouth 3 (three) times daily.    Marland Kitchen levothyroxine (SYNTHROID, LEVOTHROID) 137 MCG tablet Take 1 tablet (137 mcg total) by mouth daily before breakfast. 90 tablet 0  . losartan (COZAAR) 100 MG tablet Take 1 tablet (100 mg total) by mouth daily. 90 tablet 0  . metoprolol (LOPRESSOR) 100 MG tablet Take 100 mg by mouth 2 (two) times daily.    . Multiple Vitamin (MULTIVITAMIN) tablet Take 2 tablets by mouth daily.     . Omega-3 Fatty Acids (FISH OIL) 1200 MG CAPS Take 2,400 mg by mouth.     No current facility-administered medications on file prior to visit.    Current Medications (verified) Current Outpatient Prescriptions  Medication Sig Dispense Refill  . aspirin 81 MG tablet Take 162 mg by mouth daily.    Marland Kitchen diltiazem (TIAZAC) 240 MG 24 hr capsule Take 240 mg by mouth daily. Take 1/2 tablet daily    . diphenoxylate-atropine (LOMOTIL) 2.5-0.025 MG tablet Take by mouth 4 (four) times daily as needed for diarrhea or loose stools.    . furosemide (LASIX) 20 MG tablet Take 1 tablet (20 mg total) by mouth 2 (two) times daily. 90 tablet 0  . gabapentin (NEURONTIN) 600 MG tablet Take 600 mg by mouth 3 (three) times daily.    Marland Kitchen levothyroxine (SYNTHROID, LEVOTHROID) 137 MCG tablet Take 1 tablet (137 mcg total) by mouth daily before breakfast. 90 tablet 0  . losartan (COZAAR) 100 MG tablet Take 1 tablet (100 mg total) by mouth daily. 90 tablet 0  . metoprolol (LOPRESSOR) 100 MG tablet Take 100 mg by mouth 2 (two) times daily.    . Multiple Vitamin (MULTIVITAMIN) tablet Take 2 tablets by mouth daily.     . Omega-3 Fatty Acids (FISH OIL) 1200 MG CAPS Take 2,400 mg by mouth.     No current facility-administered medications for this visit.     Allergies  (verified) Review of patient's allergies indicates no known allergies.   PAST HISTORY  Family History Family History  Problem Relation Age of Onset  . Stroke Mother   . Heart attack Father     Social History Social History  Substance Use Topics  . Smoking status: Never Smoker   . Smokeless tobacco: Never Used  . Alcohol Use: No     Are there smokers in your home (other than you)? {yes/no:20286}  Risk Factors Current  exercise habits: {exercise:19826}  Dietary issues discussed: ***   Cardiac risk factors: {risk factors:510}.  Depression Screen (Note: if answer to either of the following is "Yes", a more complete depression screening is indicated)   Over the past two weeks, have you felt down, depressed or hopeless? {yes/no:20286}  Over the past two weeks, have you felt little interest or pleasure in doing things? {yes/no:20286}  Have you lost interest or pleasure in daily life? {yes/no:20286}  Do you often feel hopeless? {yes/no:20286}  Do you cry easily over simple problems? {yes/no:20286}  Activities of Daily Living In your present state of health, do you have any difficulty performing the following activities?:  Driving? {yes/no:20286} Managing money?  {Responses; yes/no (default no):140031::"No"} Feeding yourself? {yes/no:20286} Getting from bed to chair? {yes/no:20286}{exam, Complete:18323} Climbing a flight of stairs? {yes/no:20286} Preparing food and eating?: {yes/no (default no):140031::"No"} Bathing or showering? {Responses; yes/no (default no):140031::"No"} Getting dressed: {yes/no (default no):140031::"No"} Getting to the toilet? {Responses; yes/no (default no):140031::"No"} Using the toilet:{yes/no (default no):140031::"No"} Moving around from place to place: {yes/no (default no):140031::"No"} In the past year have you fallen or had a near fall?:{yes/no (default no):140031::"No"}   Are you sexually active?  {yes/no:20286}  Do you have more than one  partner?  {Responses; yes/no (default no):140031::"No"}  Hearing Difficulties: {yes/no (default no):140031::"No"} Do you often ask people to speak up or repeat themselves? {Responses; yes/no (default no):140031::"No"} Do you experience ringing or noises in your ears? {Responses; yes/no (default no):140031::"No"} Do you have difficulty understanding soft or whispered voices? {Responses; yes/no (default no):140031::"No"}   Do you feel that you have a problem with memory? {yes/no:20286}  Do you often misplace items? {yes/no:20286}  Do you feel safe at home?  {yes/no:20286}  Cognitive Testing  Alert? {yes/no:20286}  Normal Appearance?{yes/no:20286}  Oriented to person? {yes/no:20286}  Place? {yes/no:20286}   Time? {yes/no:20286}  Recall of three objects?  {yes/no:20286}  Can perform simple calculations? {yes/no:20286}  Displays appropriate judgment?{yes/no:20286}  Can read the correct time from a watch face?{yes/no:20286}   Advanced Directives have been discussed with the patient? {yes/no:20286}  List the Names of Other Physician/Practitioners you currently use: 1.    Indicate any recent Medical Services you may have received from other than [facility] providers in the past year (date may be approximate).  Immunization History  Administered Date(s) Administered  . H1N1 02/14/2008  . Influenza,inj,Quad PF,36+ Mos 11/23/2014  . Tdap 11/23/2014    Screening Tests Health Maintenance  Topic Date Due  . ZOSTAVAX  06/28/1987  . DEXA SCAN  06/27/1992  . PNA vac Low Risk Adult (1 of 2 - PCV13) 06/27/1992  . INFLUENZA VACCINE  09/11/2015  . TETANUS/TDAP  11/22/2024    All answers were reviewed with the patient and necessary referrals were made:  Georganna Maxson, MD   01/03/2015   History reviewed: {history reviewed:20406::"allergies","current medications","past family history","past medical history","past social history","past surgical history","problem list"}  Review of  Systems {ros; complete:30496}    Objective:     Vision by Snellen chart: right eye:{vision:19455::"20/20"}, left eye:{vision:19455::"20/20"}  Body mass index is 24.78 kg/(m^2). BP 144/82 mmHg  Pulse 79  Temp(Src) 97.6 F (36.4 C)  Resp 18  Wt 148 lb 14.4 oz (67.541 kg)  SpO2 97%  {Exam, female:18323}     Assessment:     ***     Plan:     During the course of the visit the patient was educated and counseled about appropriate screening and preventive services including:    {plan:19836}  Diet review for nutrition referral? Yes  ____  Not Indicated ____   Patient Instructions (the written plan) was given to the patient.  Medicare Attestation I have personally reviewed: The patient's medical and social history Their use of alcohol, tobacco or illicit drugs Their current medications and supplements The patient's functional ability including ADLs,fall risks, home safety risks, cognitive, and hearing and visual impairment Diet and physical activities Evidence for depression or mood disorders  The patient's weight, height, BMI, and visual acuity have been recorded in the chart.  I have made referrals, counseling, and provided education to the patient based on review of the above and I have provided the patient with a written personalized care plan for preventive services.     Landers Prajapati, MD   01/03/2015

## 2015-01-03 NOTE — Progress Notes (Addendum)
Subjective:    Tracie Nguyen is a 81 y.o. female who presents for Medicare Annual/Subsequent preventive examination.  Preventive Screening-Counseling & Management  Tobacco History  Smoking status  . Never Smoker   Smokeless tobacco  . Never Used     Problems Prior to Visit 1. Tracie Nguyen is also here today for surgical clearance for dental surgery. She is having surgical removal of teeth for 316, 19, 21-27, 30, 32 and removal of the tori. She is scheduled for surgery on December 21. She has a history of atrial fibrillation, hypothyroidism, hypertension and chronic kidney disease. She is only on aspirin for her atrial fibrillation and takes metoprolol for rate control.  No problems with anesthesia in the past.   No CP or SOB.   She is able to walk about 100 feet before stopping to rest.   No swallowing problems.    Current Problems (verified) Patient Active Problem List   Diagnosis Date Noted  . Blindness of left eye 01/03/2015  . Hereditary and idiopathic peripheral neuropathy 01/03/2015  . Hypothyroidism 01/03/2015  . CKD (chronic kidney disease) stage 3, GFR 30-59 ml/min 12/22/2014  . Hyperlipidemia 12/18/2014  . Primary osteoarthritis of right knee 12/04/2014  . Atrial fibrillation ([place]) 12/04/2014  . TROCHANTERIC BURSITIS, LEFT 08/23/2007  . HYPERTENSION, BENIGN ESSENTIAL 01/19/2006  . ARTHROPATHY NOS, SHOULDER 01/19/2006  . PERNICIOUS ANEMIA 12/25/2005    Medications Prior to Visit Current Outpatient Prescriptions on File Prior to Visit  Medication Sig Dispense Refill  . aspirin 81 MG tablet Take 162 mg by mouth daily.    Marland Kitchen diltiazem (TIAZAC) 240 MG 24 hr capsule Take 240 mg by mouth daily. Take 1/2 tablet daily    . diphenoxylate-atropine (LOMOTIL) 2.5-0.025 MG tablet Take by mouth 4 (four) times daily as needed for diarrhea or loose stools.    . gabapentin (NEURONTIN) 600 MG tablet 1/2 in AM, 1 at noon, 1 at bedtime.    Marland Kitchen levothyroxine (SYNTHROID, LEVOTHROID)  137 MCG tablet Take 1 tablet (137 mcg total) by mouth daily before breakfast. 90 tablet 0  . losartan (COZAAR) 100 MG tablet Take 1 tablet (100 mg total) by mouth daily. 90 tablet 0  . metoprolol (LOPRESSOR) 100 MG tablet Take 50 mg by mouth 2 (two) times daily.     . Multiple Vitamin (MULTIVITAMIN) tablet Take 2 tablets by mouth daily.     . Omega-3 Fatty Acids (FISH OIL) 1200 MG CAPS Take 2,400 mg by mouth.     No current facility-administered medications on file prior to visit.    Current Medications (verified) Current Outpatient Prescriptions  Medication Sig Dispense Refill  . aspirin 81 MG tablet Take 162 mg by mouth daily.    Marland Kitchen diltiazem (TIAZAC) 240 MG 24 hr capsule Take 240 mg by mouth daily. Take 1/2 tablet daily    . diphenoxylate-atropine (LOMOTIL) 2.5-0.025 MG tablet Take by mouth 4 (four) times daily as needed for diarrhea or loose stools.    . furosemide (LASIX) 20 MG tablet Take 1 tablet (20 mg total) by mouth every other day. 1 tablet 0  . gabapentin (NEURONTIN) 600 MG tablet 1/2 in AM, 1 at noon, 1 at bedtime.    Marland Kitchen levothyroxine (SYNTHROID, LEVOTHROID) 137 MCG tablet Take 1 tablet (137 mcg total) by mouth daily before breakfast. 90 tablet 0  . losartan (COZAAR) 100 MG tablet Take 1 tablet (100 mg total) by mouth daily. 90 tablet 0  . metoprolol (LOPRESSOR) 100 MG tablet Take 50 mg by  mouth 2 (two) times daily.     . Multiple Vitamin (MULTIVITAMIN) tablet Take 2 tablets by mouth daily.     . Omega-3 Fatty Acids (FISH OIL) 1200 MG CAPS Take 2,400 mg by mouth.     No current facility-administered medications for this visit.     Allergies (verified) Review of patient's allergies indicates no known allergies.   PAST HISTORY  Family History Family History  Problem Relation Age of Onset  . Stroke Mother   . Heart attack Father     Social History Social History  Substance Use Topics  . Smoking status: Never Smoker   . Smokeless tobacco: Never Used  . Alcohol Use:  No     Are there smokers in your home (other than you)? No  Risk Factors Current exercise habits: The patient does not participate in regular exercise at present.  Dietary issues discussed: None   Cardiac risk factors: advanced age (older than 24 for men, 77 for women) and hypertension.  Depression Screen (Note: if answer to either of the following is "Yes", a more complete depression screening is indicated)   Over the past two weeks, have you felt down, depressed or hopeless? No  Over the past two weeks, have you felt little interest or pleasure in doing things? No  Have you lost interest or pleasure in daily life? No  Do you often feel hopeless? No  Do you cry easily over simple problems? No  Activities of Daily Living In your present state of health, do you have any difficulty performing the following activities?:  Driving? Yes Managing money?  Yes Feeding yourself? No Getting from bed to chair? No   Climbing a flight of stairs? Yes Preparing food and eating?: No Bathing or showering? No Getting dressed: No Getting to the toilet? No Using the toilet:No Moving around from place to place: No In the past year have you fallen or had a near fall?:Yes   Are you sexually active?  No    Hearing Difficulties: No Do you often ask people to speak up or repeat themselves? No Do you experience ringing or noises in your ears? No Do you have difficulty understanding soft or whispered voices? No   Do you feel that you have a problem with memory? Yes  Do you often misplace items? Yes  Do you feel safe at home?  Yes  Cognitive Testing  Alert? Yes  Normal Appearance?Yes  Oriented to person? Yes  Place? Yes   Time? Yes     6 CIT score of 14/28 ( abnormal )    Advanced Directives have been discussed with the patient? No  List the Names of Other Physician/Practitioners you currently use: 1.  Dr. Jetta Lout - oral surgeon 2.  Dr. Kirk Ruths - Cardiology.  First appt next  month.   Indicate any recent Medical Services you may have received from other than [facility] providers in the past year (date may be approximate).  Immunization History  Administered Date(s) Administered  . H1N1 02/14/2008  . Influenza,inj,Quad PF,36+ Mos 11/23/2014  . Tdap 11/23/2014    Screening Tests Health Maintenance  Topic Date Due  . ZOSTAVAX  06/28/1987  . DEXA SCAN  06/27/1992  . PNA vac Low Risk Adult (1 of 2 - PCV13) 06/27/1992  . INFLUENZA VACCINE  09/11/2015  . TETANUS/TDAP  11/22/2024    All answers were reviewed with the patient and necessary referrals were made:  Seva Chancy, MD   01/03/2015   History reviewed: allergies,  current medications, past family history, past medical history, past social history, past surgical history and problem list  Review of Systems Pertinent items noted in HPI and remainder of comprehensive ROS otherwise negative.    Objective:     Vision by Snellen chart: right eye:20/70, left eye:20/200  Body mass index is 24.78 kg/(m^2). BP 144/82 mmHg  Pulse 79  Temp(Src) 97.6 F (36.4 C)  Resp 18  Wt 148 lb 14.4 oz (67.541 kg)  SpO2 97%  BP 144/82 mmHg  Pulse 79  Temp(Src) 97.6 F (36.4 C)  Resp 18  Wt 148 lb 14.4 oz (67.541 kg)  SpO2 97% General appearance: alert, cooperative and appears stated age Head: Normocephalic, without obvious abnormality, atraumatic Eyes: conj clear, EOMI, pEERLA Ears: normal TM's and external ear canals both ears Nose: Nares normal. Septum midline. Mucosa normal. No drainage or sinus tenderness. Throat: dental decay Neck: no adenopathy, no carotid bruit, no JVD, supple, symmetrical, trachea midline and thyroid not enlarged, symmetric, no tenderness/mass/nodules Back: symmetric, no curvature. ROM normal. No CVA tenderness. Lungs: clear to auscultation bilaterally Heart: regular rate and rhythm, S1, S2 normal, no murmur, click, rub or gallop Abdomen: soft, non-tender; bowel sounds normal; no  masses,  no organomegaly Extremities: extremities normal, atraumatic, no cyanosis or edema Pulses: 2+ and symmetric Skin: Skin color, texture, turgor normal. No rashes or lesions Lymph nodes: Cervical adenopathy: nl and Supraclavicular adenopathy: nl Neurologic: Alert and oriented X 3, normal strength and tone. Normal symmetric reflexes. Normal coordination and gait     Assessment:     Medicare wellness exam       Plan:     During the course of the visit the patient was educated and counseled about appropriate screening and preventive services including:    Needs record to complete vaccine record.    Needs UTD labs.    She is clear for surgery.  Activity level up to 3 mets.  The [place] Peri-operative risk is 0.78%, thus she is low risk for non cardiac surgery.    Diet review for nutrition referral? Yes ____  Not Indicated _x__   Patient Instructions (the written plan) was given to the patient.  Medicare Attestation I have personally reviewed: The patient's medical and social history Their use of alcohol, tobacco or illicit drugs Their current medications and supplements The patient's functional ability including ADLs,fall risks, home safety risks, cognitive, and hearing and visual impairment Diet and physical activities Evidence for depression or mood disorders  The patient's weight, height, BMI, and visual acuity have been recorded in the chart.  I have made referrals, counseling, and provided education to the patient based on review of the above and I have provided the patient with a written personalized care plan for preventive services.     Markisha Meding, MD   01/03/2015

## 2015-01-08 NOTE — Addendum Note (Signed)
Addended by: Elizabeth Sauer on: 01/08/2015 04:29 PM   Modules accepted: Medications

## 2015-01-10 DIAGNOSIS — Z7982 Long term (current) use of aspirin: Secondary | ICD-10-CM | POA: Diagnosis not present

## 2015-01-10 DIAGNOSIS — M1711 Unilateral primary osteoarthritis, right knee: Secondary | ICD-10-CM | POA: Diagnosis not present

## 2015-01-10 DIAGNOSIS — N183 Chronic kidney disease, stage 3 (moderate): Secondary | ICD-10-CM | POA: Diagnosis not present

## 2015-01-10 DIAGNOSIS — R079 Chest pain, unspecified: Secondary | ICD-10-CM | POA: Diagnosis not present

## 2015-01-10 DIAGNOSIS — G609 Hereditary and idiopathic neuropathy, unspecified: Secondary | ICD-10-CM | POA: Diagnosis not present

## 2015-01-10 DIAGNOSIS — I4891 Unspecified atrial fibrillation: Secondary | ICD-10-CM | POA: Diagnosis not present

## 2015-01-10 DIAGNOSIS — E785 Hyperlipidemia, unspecified: Secondary | ICD-10-CM | POA: Diagnosis not present

## 2015-01-10 DIAGNOSIS — I129 Hypertensive chronic kidney disease with stage 1 through stage 4 chronic kidney disease, or unspecified chronic kidney disease: Secondary | ICD-10-CM | POA: Diagnosis not present

## 2015-01-10 DIAGNOSIS — E039 Hypothyroidism, unspecified: Secondary | ICD-10-CM | POA: Diagnosis not present

## 2015-01-10 DIAGNOSIS — K029 Dental caries, unspecified: Secondary | ICD-10-CM | POA: Diagnosis not present

## 2015-01-22 ENCOUNTER — Encounter: Payer: Private Health Insurance - Indemnity | Admitting: Family Medicine

## 2015-01-23 NOTE — Progress Notes (Signed)
HPI: 81 year old female for evaluation of atrial fibrillation. Patient recently moved here from [place] and presents to establish. She has had atrial fibrillation for 3-4 years by report. I do not have records available. She was on Coumadin previously but apparently this was discontinued because of bleeding. She has some dyspnea on exertion but no orthopnea or PND. Pedal edema is controlled with diuretics. She does not have exertional chest pain, palpitations or syncope.  Current Outpatient Prescriptions  Medication Sig Dispense Refill  . aspirin 81 MG tablet Take 162 mg by mouth daily.    Marland Kitchen diltiazem (TIAZAC) 240 MG 24 hr capsule Take 240 mg by mouth daily.     . diphenoxylate-atropine (LOMOTIL) 2.5-0.025 MG tablet Take by mouth 4 (four) times daily as needed for diarrhea or loose stools.    . furosemide (LASIX) 20 MG tablet Take 1 tablet (20 mg total) by mouth every other day. 1 tablet 0  . gabapentin (NEURONTIN) 600 MG tablet 1/2 in AM, 1 at noon, 1 at bedtime.    Marland Kitchen levothyroxine (SYNTHROID, LEVOTHROID) 137 MCG tablet Take 1 tablet (137 mcg total) by mouth daily before breakfast. 90 tablet 0  . losartan (COZAAR) 100 MG tablet Take 1 tablet (100 mg total) by mouth daily. 90 tablet 0  . metoprolol (LOPRESSOR) 100 MG tablet Take 50 mg by mouth daily.     . Multiple Vitamin (MULTIVITAMIN) tablet Take 2 tablets by mouth daily.     . Omega-3 Fatty Acids (FISH OIL) 1200 MG CAPS Take 2,400 mg by mouth.     No current facility-administered medications for this visit.    No Known Allergies   Past Medical History  Diagnosis Date  . Hypertension   . Atrial fibrillation ([place])   . Hypothyroid     Past Surgical History  Procedure Laterality Date  . Total hip arthroplasty    . Tonsillectomy      as a child  . Cataract extraction bilateral w/ anterior vitrectomy Right     Social History   Social History  . Marital Status: Widowed    Spouse Name: N/A  . Number of Children: 2  .  Years of Education: N/A   Occupational History  . Not on file.   Social History Main Topics  . Smoking status: Never Smoker   . Smokeless tobacco: Never Used  . Alcohol Use: No  . Drug Use: No  . Sexual Activity: Not Currently   Other Topics Concern  . Not on file   Social History Narrative   Moved here form [place], [place] to be close to family     Family History  Problem Relation Age of Onset  . Stroke Mother   . Heart attack Father     ROS: no fevers or chills, productive cough, hemoptysis, dysphasia, odynophagia, melena, hematochezia, dysuria, hematuria, rash, seizure activity, orthopnea, PND, pedal edema, claudication. Remaining systems are negative.  Physical Exam:   Blood pressure 122/62, pulse 75, height 5\' 5"  (1.651 m), weight 142 lb (64.411 kg).  General:  Well developed/well nourished in NAD Skin warm/dry Patient not depressed No peripheral clubbing Back-normal HEENT-normal/normal eyelids Neck supple/normal carotid upstroke bilaterally; no bruits; no JVD; no thyromegaly chest - CTA/ normal expansion CV - irregular/normal S1 and S2; no murmurs, rubs or gallops;  PMI nondisplaced Abdomen -NT/ND, no HSM, no mass, + bowel sounds, no bruit 2+ femoral pulses, no bruits Ext-no edema, chords, 2+ DP Neuro-grossly nonfocal  ECG Atrial fibrillation at a rate of  75. Normal axis. Nonspecific ST changes.

## 2015-01-24 ENCOUNTER — Ambulatory Visit (INDEPENDENT_AMBULATORY_CARE_PROVIDER_SITE_OTHER): Payer: Medicare Other | Admitting: Cardiology

## 2015-01-24 ENCOUNTER — Encounter: Payer: Self-pay | Admitting: Cardiology

## 2015-01-24 VITALS — BP 122/62 | HR 75 | Ht 65.0 in | Wt 142.0 lb

## 2015-01-24 DIAGNOSIS — I4821 Permanent atrial fibrillation: Secondary | ICD-10-CM

## 2015-01-24 DIAGNOSIS — I482 Chronic atrial fibrillation: Secondary | ICD-10-CM

## 2015-01-24 DIAGNOSIS — I10 Essential (primary) hypertension: Secondary | ICD-10-CM | POA: Diagnosis not present

## 2015-01-24 NOTE — Assessment & Plan Note (Signed)
Blood pressure control. Continue present medications. 

## 2015-01-24 NOTE — Assessment & Plan Note (Signed)
Patient apparently has permanent atrial fibrillation. We will obtain records from [place]. Continue Cardizem and metoprolol for rate control. She has embolic risk factors of age greater than 67, female sex and hypertension. CHADsvasc 4. However she apparently was on Coumadin previously and was hospitalized for bleeding. We will continue aspirin for now until we are able to review previous records. They understand the higher risk of stroke off of Coumadin.

## 2015-01-24 NOTE — Patient Instructions (Signed)
Dr Stanford Breed has made no changes today in your current medications or treatment plan.  Your physician recommends that you schedule a follow-up appointment in 8 weeks. You will receive a reminder letter in the mail two months in advance. If you don't receive a letter, please call our office to schedule the follow-up appointment.  If you need a refill on your cardiac medications before your next appointment, please call your pharmacy.

## 2015-01-25 ENCOUNTER — Encounter: Payer: Self-pay | Admitting: Family Medicine

## 2015-01-25 ENCOUNTER — Telehealth: Payer: Self-pay

## 2015-01-25 NOTE — Telephone Encounter (Signed)
Faxed signed release to [facility] ([place]) - Dr Lawerance Sabal - to obtain records per Dr Jacalyn Lefevre request. Faxed to [phone] on 01/25/15. Faxed signed release to [facility] ([place]) - Christell Constant, DO- to obtain records per Dr Jacalyn Lefevre request. Faxed to [phone] on 01/25/15.

## 2015-02-27 DIAGNOSIS — N183 Chronic kidney disease, stage 3 (moderate): Secondary | ICD-10-CM | POA: Diagnosis not present

## 2015-02-27 DIAGNOSIS — E039 Hypothyroidism, unspecified: Secondary | ICD-10-CM | POA: Diagnosis not present

## 2015-02-27 DIAGNOSIS — I10 Essential (primary) hypertension: Secondary | ICD-10-CM | POA: Diagnosis not present

## 2015-02-27 LAB — CBC WITH DIFFERENTIAL/PLATELET
BASOS PCT: 0 % (ref 0–1)
Basophils Absolute: 0 10*3/uL (ref 0.0–0.1)
Eosinophils Absolute: 0.9 10*3/uL — ABNORMAL HIGH (ref 0.0–0.7)
Eosinophils Relative: 10 % — ABNORMAL HIGH (ref 0–5)
HEMATOCRIT: 34.9 % — AB (ref 36.0–46.0)
HEMOGLOBIN: 11.1 g/dL — AB (ref 12.0–15.0)
Lymphocytes Relative: 28 % (ref 12–46)
Lymphs Abs: 2.6 10*3/uL (ref 0.7–4.0)
MCH: 28.2 pg (ref 26.0–34.0)
MCHC: 31.8 g/dL (ref 30.0–36.0)
MCV: 88.8 fL (ref 78.0–100.0)
MONO ABS: 0.8 10*3/uL (ref 0.1–1.0)
MONOS PCT: 9 % (ref 3–12)
MPV: 10.4 fL (ref 8.6–12.4)
NEUTROS ABS: 4.9 10*3/uL (ref 1.7–7.7)
Neutrophils Relative %: 53 % (ref 43–77)
Platelets: 283 10*3/uL (ref 150–400)
RBC: 3.93 MIL/uL (ref 3.87–5.11)
RDW: 14.8 % (ref 11.5–15.5)
WBC: 9.3 10*3/uL (ref 4.0–10.5)

## 2015-02-28 LAB — COMPLETE METABOLIC PANEL WITH GFR
ALBUMIN: 4 g/dL (ref 3.6–5.1)
ALT: 12 U/L (ref 6–29)
AST: 16 U/L (ref 10–35)
Alkaline Phosphatase: 54 U/L (ref 33–130)
BILIRUBIN TOTAL: 0.4 mg/dL (ref 0.2–1.2)
BUN: 33 mg/dL — AB (ref 7–25)
CALCIUM: 9.4 mg/dL (ref 8.6–10.4)
CHLORIDE: 109 mmol/L (ref 98–110)
CO2: 22 mmol/L (ref 20–31)
CREATININE: 1.42 mg/dL — AB (ref 0.60–0.88)
GFR, Est African American: 38 mL/min — ABNORMAL LOW (ref >=60)
GFR, Est Non African American: 33 mL/min — ABNORMAL LOW (ref >=60)
Glucose, Bld: 81 mg/dL (ref 65–99)
Potassium: 4.5 mmol/L (ref 3.5–5.3)
Sodium: 143 mmol/L (ref 135–146)
TOTAL PROTEIN: 6.8 g/dL (ref 6.1–8.1)

## 2015-02-28 LAB — TSH: TSH: 0.537 u[IU]/mL (ref 0.350–4.500)

## 2015-03-08 ENCOUNTER — Telehealth: Payer: Self-pay

## 2015-03-08 NOTE — Telephone Encounter (Signed)
Faxed records release to [place] Aviva Signs, DO) in [place]

## 2015-03-13 NOTE — Progress Notes (Signed)
      HPI: FU atrial fibrillation. Patient recently moved here from [place]. She has had atrial fibrillation for 3-4 years by report. She was not anticoagulated previously because of fall risk. Echocardiogram October 2015 showed normal LV function, moderate biatrial enlargement, mild to moderate mitral regurgitation, moderate to severe tricuspid regurgitation and moderately elevated pulmonary pressures. Since last seen, She denies dyspnea, chest pain, palpitations or syncope. No bleeding. Since beginning to use her walker she has not had a fall.  Current Outpatient Prescriptions  Medication Sig Dispense Refill  . aspirin 81 MG tablet Take 162 mg by mouth daily.    Marland Kitchen diltiazem (TIAZAC) 240 MG 24 hr capsule Take 240 mg by mouth daily.     . diphenoxylate-atropine (LOMOTIL) 2.5-0.025 MG tablet Take by mouth 4 (four) times daily as needed for diarrhea or loose stools.    . furosemide (LASIX) 20 MG tablet Take 1 tablet (20 mg total) by mouth every other day. (Patient taking differently: Take 20 mg by mouth as directed. 1 tablet twice a week) 1 tablet 0  . gabapentin (NEURONTIN) 600 MG tablet 1/2 in AM, 1 at noon, 1 at bedtime.    Marland Kitchen levothyroxine (SYNTHROID, LEVOTHROID) 137 MCG tablet Take 1 tablet (137 mcg total) by mouth daily before breakfast. 90 tablet 0  . losartan (COZAAR) 100 MG tablet Take 1 tablet (100 mg total) by mouth daily. 90 tablet 0  . metoprolol (LOPRESSOR) 100 MG tablet Take 50 mg by mouth daily.     . Multiple Vitamin (MULTIVITAMIN) tablet Take 2 tablets by mouth daily.     . Omega-3 Fatty Acids (FISH OIL) 1200 MG CAPS Take 2,400 mg by mouth.     No current facility-administered medications for this visit.     Past Medical History  Diagnosis Date  . Hypertension   . Atrial fibrillation ([place])   . Hypothyroid     Past Surgical History  Procedure Laterality Date  . Total hip arthroplasty    . Tonsillectomy      as a child  . Cataract extraction bilateral w/ anterior  vitrectomy Right     Social History   Social History  . Marital Status: Widowed    Spouse Name: N/A  . Number of Children: 2  . Years of Education: N/A   Occupational History  . Not on file.   Social History Main Topics  . Smoking status: Never Smoker   . Smokeless tobacco: Never Used  . Alcohol Use: No  . Drug Use: No  . Sexual Activity: Not Currently   Other Topics Concern  . Not on file   Social History Narrative   Moved here form [place], [place] to be close to family     Family History  Problem Relation Age of Onset  . Stroke Mother   . Heart attack Father     ROS: no fevers or chills, productive cough, hemoptysis, dysphasia, odynophagia, melena, hematochezia, dysuria, hematuria, rash, seizure activity, orthopnea, PND, pedal edema, claudication. Remaining systems are negative.  Physical Exam: Well-developed well-nourished in no acute distress.  Skin is warm and dry.  HEENT is normal.  Neck is supple.  Chest is clear to auscultation with normal expansion.  Cardiovascular exam is irregular Abdominal exam nontender or distended. No masses palpated. Extremities show no edema. neuro grossly intact

## 2015-03-14 ENCOUNTER — Encounter: Payer: Self-pay | Admitting: Cardiology

## 2015-03-14 ENCOUNTER — Ambulatory Visit (INDEPENDENT_AMBULATORY_CARE_PROVIDER_SITE_OTHER): Payer: Medicare Other | Admitting: Cardiology

## 2015-03-14 VITALS — BP 134/66 | HR 72 | Ht 65.0 in | Wt 143.8 lb

## 2015-03-14 DIAGNOSIS — I4891 Unspecified atrial fibrillation: Secondary | ICD-10-CM

## 2015-03-14 DIAGNOSIS — E785 Hyperlipidemia, unspecified: Secondary | ICD-10-CM

## 2015-03-14 DIAGNOSIS — I10 Essential (primary) hypertension: Secondary | ICD-10-CM | POA: Diagnosis not present

## 2015-03-14 MED ORDER — APIXABAN 2.5 MG PO TABS: 2.5000 mg | ORAL_TABLET | Freq: Two times a day (BID) | ORAL | Status: DC

## 2015-03-14 NOTE — Assessment & Plan Note (Signed)
Patient remains in atrial fibrillation. She is asymptomatic. Continue rate control. Continue Cardizem and metoprolol. Embolic risk factors of female sex, age greater than 75, And hypertension. CHADSvasc 4. The patient has not fallen since using her walker. We have discussed the risks and benefits of anticoagulation and I feel the benefit outweighs the risk. Discontinue aspirin. Begin apixaban 2.5 BID ( patient is 82 years old, weight 65 kg and creatinine 1.4-1.7). Check hemoglobin and renal function in 4 weeks. If she has problems with bleeding we will discontinue this medication.

## 2015-03-14 NOTE — Assessment & Plan Note (Signed)
Blood pressure controlled. Continue present medications. 

## 2015-03-14 NOTE — Assessment & Plan Note (Signed)
Management per primary care. 

## 2015-03-14 NOTE — Patient Instructions (Signed)
Medication Instructions:   STOP ASPIRIN  START ELIQUIS 2.5 MG ONE TABLE TWICE DAILY  Labwork:  Your physician recommends that you return for lab work in: [place]:  Your physician recommends that you schedule a follow-up appointment in: [place]   If you need a refill on your cardiac medications before your next appointment, please call your pharmacy.

## 2015-03-17 ENCOUNTER — Other Ambulatory Visit: Payer: Self-pay | Admitting: Family Medicine

## 2015-03-30 ENCOUNTER — Encounter: Payer: Self-pay | Admitting: Cardiology

## 2015-04-07 ENCOUNTER — Encounter: Payer: Self-pay | Admitting: Family Medicine

## 2015-04-09 MED ORDER — GABAPENTIN 600 MG PO TABS: ORAL_TABLET | ORAL | Status: DC

## 2015-04-10 DIAGNOSIS — I4891 Unspecified atrial fibrillation: Secondary | ICD-10-CM | POA: Diagnosis not present

## 2015-04-10 LAB — CBC
HEMATOCRIT: 36.2 % (ref 36.0–46.0)
HEMOGLOBIN: 11.5 g/dL — AB (ref 12.0–15.0)
MCH: 27.2 pg (ref 26.0–34.0)
MCHC: 31.8 g/dL (ref 30.0–36.0)
MCV: 85.6 fL (ref 78.0–100.0)
MPV: 10.6 fL (ref 8.6–12.4)
Platelets: 246 10*3/uL (ref 150–400)
RBC: 4.23 MIL/uL (ref 3.87–5.11)
RDW: 15.4 % (ref 11.5–15.5)
WBC: 9 10*3/uL (ref 4.0–10.5)

## 2015-04-11 LAB — BASIC METABOLIC PANEL
BUN: 27 mg/dL — AB (ref 7–25)
CALCIUM: 9.2 mg/dL (ref 8.6–10.4)
CHLORIDE: 108 mmol/L (ref 98–110)
CO2: 25 mmol/L (ref 20–31)
CREATININE: 1.16 mg/dL — AB (ref 0.60–0.88)
GLUCOSE: 70 mg/dL (ref 65–99)
Potassium: 4.6 mmol/L (ref 3.5–5.3)
Sodium: 143 mmol/L (ref 135–146)

## 2015-04-12 ENCOUNTER — Telehealth: Payer: Self-pay | Admitting: *Deleted

## 2015-04-12 NOTE — Telephone Encounter (Signed)
Pt needs clearance for placement of 3 dental implants in the lower jaw for a retained lower denture and the okay for her to hold eliquis prior to the procedure. Per dr Stanford Breed, pt can hold eliquis 2 days prior to the procedure and she is to restart the day after. Form faxed to the number provided.

## 2015-06-15 NOTE — Progress Notes (Signed)
      HPI: FU atrial fibrillation. Patient moved here from [place]. She has had atrial fibrillation for 3-4 years by report. She was not anticoagulated previously because of fall risk. Echocardiogram October 2015 showed normal LV function, moderate biatrial enlargement, mild to moderate mitral regurgitation, moderate to severe tricuspid regurgitation and moderately elevated pulmonary pressures. At last ov apixaban initiated. Since last seen, She has some dyspnea on exertion but no orthopnea, PND, pedal edema, chest pain, syncope or bleeding.  Current Outpatient Prescriptions  Medication Sig Dispense Refill  . apixaban (ELIQUIS) 2.5 MG TABS tablet Take 1 tablet (2.5 mg total) by mouth 2 (two) times daily. 60 tablet 6  . diltiazem (TIAZAC) 240 MG 24 hr capsule Take 240 mg by mouth daily.     . diphenoxylate-atropine (LOMOTIL) 2.5-0.025 MG tablet Take by mouth 4 (four) times daily as needed for diarrhea or loose stools.    . furosemide (LASIX) 20 MG tablet Take 1 tablet (20 mg total) by mouth every other day. (Patient taking differently: Take 20 mg by mouth as directed. 1 tablet twice a week) 1 tablet 0  . gabapentin (NEURONTIN) 600 MG tablet 1/2 tab PO AM, 1 at noon, 1 at bedtime 225 tablet 1  . levothyroxine (SYNTHROID, LEVOTHROID) 137 MCG tablet TAKE 1 TABLET(137 MCG) BY MOUTH DAILY BEFORE BREAKFAST 90 tablet 0  . losartan (COZAAR) 100 MG tablet TAKE 1 TABLET(100 MG) BY MOUTH DAILY 90 tablet 0  . metoprolol (LOPRESSOR) 100 MG tablet Take 50 mg by mouth daily.     . Multiple Vitamin (MULTIVITAMIN) tablet Take 2 tablets by mouth daily.     . Omega-3 Fatty Acids (FISH OIL) 1200 MG CAPS Take 2,400 mg by mouth.     No current facility-administered medications for this visit.     Past Medical History  Diagnosis Date  . Hypertension   . Atrial fibrillation ([place])   . Hypothyroid     Past Surgical History  Procedure Laterality Date  . Total hip arthroplasty    . Tonsillectomy      as a child   . Cataract extraction bilateral w/ anterior vitrectomy Right     Social History   Social History  . Marital Status: Widowed    Spouse Name: N/A  . Number of Children: 2  . Years of Education: N/A   Occupational History  . Not on file.   Social History Main Topics  . Smoking status: Never Smoker   . Smokeless tobacco: Never Used  . Alcohol Use: No  . Drug Use: No  . Sexual Activity: Not Currently   Other Topics Concern  . Not on file   Social History Narrative   Moved here form [place], [place] to be close to family     Family History  Problem Relation Age of Onset  . Stroke Mother   . Heart attack Father     ROS: no fevers or chills, productive cough, hemoptysis, dysphasia, odynophagia, melena, hematochezia, dysuria, hematuria, rash, seizure activity, orthopnea, PND, pedal edema, claudication. Remaining systems are negative.  Physical Exam: Well-developed well-nourished in no acute distress.  Skin is warm and dry.  HEENT is normal.  Neck is supple.  Chest is clear to auscultation with normal expansion.  Cardiovascular exam is irregular Abdominal exam nontender or distended. No masses palpated. Extremities show no edema. neuro grossly intact

## 2015-06-20 ENCOUNTER — Other Ambulatory Visit: Payer: Self-pay | Admitting: Family Medicine

## 2015-06-20 ENCOUNTER — Ambulatory Visit (INDEPENDENT_AMBULATORY_CARE_PROVIDER_SITE_OTHER): Payer: Medicare Other | Admitting: Cardiology

## 2015-06-20 ENCOUNTER — Encounter: Payer: Self-pay | Admitting: Cardiology

## 2015-06-20 VITALS — BP 150/77 | HR 75 | Ht 65.0 in | Wt 147.0 lb

## 2015-06-20 DIAGNOSIS — E785 Hyperlipidemia, unspecified: Secondary | ICD-10-CM | POA: Diagnosis not present

## 2015-06-20 DIAGNOSIS — I10 Essential (primary) hypertension: Secondary | ICD-10-CM

## 2015-06-20 DIAGNOSIS — I4891 Unspecified atrial fibrillation: Secondary | ICD-10-CM

## 2015-06-20 MED ORDER — APIXABAN 5 MG PO TABS: 5.0000 mg | ORAL_TABLET | Freq: Two times a day (BID) | ORAL | Status: DC

## 2015-06-20 NOTE — Assessment & Plan Note (Signed)
Management per primary care. 

## 2015-06-20 NOTE — Assessment & Plan Note (Signed)
Patient remains in atrial fibrillation. Continue present medications for rate control. Most recent creatinine was less than 1.5 and weight is 66 kg. Increase apixaban to 5 mg twice a day.

## 2015-06-20 NOTE — Assessment & Plan Note (Signed)
Blood pressure mildly elevated. I have asked her to follow this at home and we will adjust regimen as needed.

## 2015-06-20 NOTE — Patient Instructions (Addendum)
Medication Instructions:   INCREASE ELIQUIS TO 5 MG TWICE DAILY= 2 OF THE 2.5 MG TABLETS TWICE DAILY  Follow-Up:  Your physician wants you to follow-up in: [place] will receive a reminder letter in the mail two months in advance. If you don't receive a letter, please call our office to schedule the follow-up appointment.

## 2015-08-31 ENCOUNTER — Emergency Department (INDEPENDENT_AMBULATORY_CARE_PROVIDER_SITE_OTHER)
Admission: EM | Admit: 2015-08-31 | Discharge: 2015-08-31 | Disposition: A | Discharge status: Home / Self Care | Payer: Medicare Other | Source: Home / Self Care | Attending: Family Medicine | Admitting: Family Medicine

## 2015-08-31 ENCOUNTER — Encounter: Payer: Self-pay | Admitting: Emergency Medicine

## 2015-08-31 ENCOUNTER — Ambulatory Visit (HOSPITAL_BASED_OUTPATIENT_CLINIC_OR_DEPARTMENT_OTHER)
Admit: 2015-08-31 | Discharge: 2015-08-31 | Disposition: A | Discharge status: Home / Self Care | Payer: Medicare Other | Attending: Family Medicine | Admitting: Family Medicine

## 2015-08-31 DIAGNOSIS — M79662 Pain in left lower leg: Secondary | ICD-10-CM | POA: Diagnosis not present

## 2015-08-31 DIAGNOSIS — M7989 Other specified soft tissue disorders: Secondary | ICD-10-CM | POA: Diagnosis not present

## 2015-08-31 DIAGNOSIS — R609 Edema, unspecified: Secondary | ICD-10-CM

## 2015-08-31 DIAGNOSIS — M79661 Pain in right lower leg: Secondary | ICD-10-CM | POA: Diagnosis not present

## 2015-08-31 NOTE — ED Provider Notes (Signed)
CSN: [id number]     Arrival date & time 08/31/15  1457 History   None    Chief Complaint  Patient presents with  . Leg Swelling   (Consider location/radiation/quality/duration/timing/severity/associated sxs/prior Treatment)  Patient complains of increased swelling in both lower legs for about 2 weeks, and increased soreness, worse in her left cals.  She denies chest pain or shortness of breath.  She is presently on Eliquis for Atrial fibrillation.    The history is provided by the patient.    Past Medical History:  Diagnosis Date  . Atrial fibrillation ([place])   . Hypertension   . Hypothyroid    Past Surgical History:  Procedure Laterality Date  . CATARACT EXTRACTION BILATERAL W/ ANTERIOR VITRECTOMY Right   . TONSILLECTOMY     as a child  . TOTAL HIP ARTHROPLASTY     Family History  Problem Relation Age of Onset  . Stroke Mother   . Heart attack Father    Social History  Substance Use Topics  . Smoking status: Never Smoker  . Smokeless tobacco: Never Used  . Alcohol use No   OB History    No data available     Review of Systems  Constitutional: Negative.   HENT: Negative.   Eyes: Negative.   Respiratory: Negative for cough, chest tightness, shortness of breath, wheezing and stridor.   Cardiovascular: Positive for leg swelling.  Gastrointestinal: Negative.   Genitourinary: Negative.   Musculoskeletal: Negative.   Skin: Negative.   Neurological: Negative for facial asymmetry and headaches.    Allergies  Review of patient's allergies indicates no known allergies.  Home Medications   Prior to Admission medications   Medication Sig Start Date End Date Taking? Authorizing Provider  apixaban (ELIQUIS) 5 MG TABS tablet Take 1 tablet (5 mg total) by mouth 2 (two) times daily. 06/20/15   Lelon Perla, MD  diltiazem (TIAZAC) 240 MG 24 hr capsule Take 240 mg by mouth daily.     Historical Provider, MD  diphenoxylate-atropine (LOMOTIL) 2.5-0.025 MG tablet Take by  mouth 4 (four) times daily as needed for diarrhea or loose stools.    Historical Provider, MD  furosemide (LASIX) 20 MG tablet Take 1 tablet (20 mg total) by mouth every other day. Patient taking differently: Take 20 mg by mouth as directed. 1 tablet twice a week 01/03/15   Hali Marry, MD  gabapentin (NEURONTIN) 600 MG tablet 1/2 tab PO AM, 1 at noon, 1 at bedtime 04/09/15   Hali Marry, MD  levothyroxine (SYNTHROID, LEVOTHROID) 137 MCG tablet TAKE 1 TABLET(137 MCG) BY MOUTH DAILY BEFORE BREAKFAST 09/11/15   Hali Marry, MD  losartan (COZAAR) 100 MG tablet TAKE 1 TABLET(100 MG) BY MOUTH DAILY 09/11/15   Hali Marry, MD  metoprolol (LOPRESSOR) 100 MG tablet Take 50 mg by mouth daily.     Historical Provider, MD  Multiple Vitamin (MULTIVITAMIN) tablet Take 2 tablets by mouth daily.     Historical Provider, MD  Omega-3 Fatty Acids (FISH OIL) 1200 MG CAPS Take 2,400 mg by mouth.    Historical Provider, MD   Meds Ordered and Administered this Visit  Medications - No data to display  BP 127/63 (BP Location: Right Arm)   Pulse 80   Temp 98.1 F (36.7 C) (Oral)   Wt 151 lb (68.5 kg)   BMI 25.13 kg/m  No data found.   Physical Exam  Constitutional: She is oriented to person, place, and time. She appears well-developed  and well-nourished. No distress.  HENT:  Head: Normocephalic.  Nose: Nose normal.  Mouth/Throat: Oropharynx is clear and moist.  Eyes: Conjunctivae are normal. Pupils are equal, round, and reactive to light.  Neck: Neck supple. No JVD present.  Cardiovascular: Normal rate and intact distal pulses.  Exam reveals no gallop.   Irregular rhythm  Pulmonary/Chest: Breath sounds normal.  Abdominal: Soft. There is no tenderness.  Musculoskeletal: She exhibits edema and tenderness.  There is bilateral calf tenderness, worse on the left.  Lymphadenopathy:    She has no cervical adenopathy.  Neurological: She is alert and oriented to person, place, and  time. She displays normal reflexes.  Skin: Skin is warm and dry. No rash noted.  Nursing note and vitals reviewed.   ED Course  Procedures (including critical care time)  Labs Review Labs Reviewed - No data to display  Imaging Review No results found.   Visual Acuity Review  Right Eye Distance:   Left Eye Distance:   Bilateral Distance:    Right Eye Near:   Left Eye Near:    Bilateral Near:         MDM   1. Bilateral calf pain   2. Dependent edema    Doubt DVT (on Eliquis), but patient does have significant increase in posterior calf tenderness.  Will schedule [place] lower extremities. Recommend wearing below knee medical support hose (8 - 3mmHg compression) daytime. Followup with Family Doctor     Kandra Nicolas, MD 09/30/15 [phone]

## 2015-08-31 NOTE — Discharge Instructions (Signed)
Recommend wearing below knee medical support hose (8 - 55mmHg compression) daytime.    Edema Edema is an abnormal buildup of fluids in your bodytissues. Edema is somewhatdependent on gravity to pull the fluid to the lowest place in your body. That makes the condition more common in the legs and thighs (lower extremities). Painless swelling of the feet and ankles is common and becomes more likely as you get older. It is also common in looser tissues, like around your eyes.  When the affected area is squeezed, the fluid may move out of that spot and leave a dent for a few moments. This dent is called pitting.  CAUSES  There are many possible causes of edema. Eating too much salt and being on your feet or sitting for a long time can cause edema in your legs and ankles. Hot weather may make edema worse. Common medical causes of edema include:  Heart failure.  Liver disease.  Kidney disease.  Weak blood vessels in your legs.  Cancer.  An injury.  Pregnancy.  Some medications.  Obesity. SYMPTOMS  Edema is usually painless.Your skin may look swollen or shiny.  DIAGNOSIS  Your health care provider may be able to diagnose edema by asking about your medical history and doing a physical exam. You may need to have tests such as X-rays, an electrocardiogram, or blood tests to check for medical conditions that may cause edema.  TREATMENT  Edema treatment depends on the cause. If you have heart, liver, or kidney disease, you need the treatment appropriate for these conditions. General treatment may include:  Elevation of the affected body part above the level of your heart.  Compression of the affected body part. Pressure from elastic bandages or support stockings squeezes the tissues and forces fluid back into the blood vessels. This keeps fluid from entering the tissues.  Restriction of fluid and salt intake.  Use of a water pill (diuretic). These medications are appropriate only for  some types of edema. They pull fluid out of your body and make you urinate more often. This gets rid of fluid and reduces swelling, but diuretics can have side effects. Only use diuretics as directed by your health care provider. HOME CARE INSTRUCTIONS   Keep the affected body part above the level of your heart when you are lying down.   Do not sit still or stand for prolonged periods.   Do not put anything directly under your knees when lying down.  Do not wear constricting clothing or garters on your upper legs.   Exercise your legs to work the fluid back into your blood vessels. This may help the swelling go down.   Wear elastic bandages or support stockings to reduce ankle swelling as directed by your health care provider.   Eat a low-salt diet to reduce fluid if your health care provider recommends it.   Only take medicines as directed by your health care provider. SEEK MEDICAL CARE IF:   Your edema is not responding to treatment.  You have heart, liver, or kidney disease and notice symptoms of edema.  You have edema in your legs that does not improve after elevating them.   You have sudden and unexplained weight gain. SEEK IMMEDIATE MEDICAL CARE IF:   You develop shortness of breath or chest pain.   You cannot breathe when you lie down.  You develop pain, redness, or warmth in the swollen areas.   You have heart, liver, or kidney disease and suddenly get  edema.  You have a fever and your symptoms suddenly get worse. MAKE SURE YOU:   Understand these instructions.  Will watch your condition.  Will get help right away if you are not doing well or get worse.   This information is not intended to replace advice given to you by your health care provider. Make sure you discuss any questions you have with your health care provider.   Document Released: 01/27/2005 Document Revised: 02/17/2014 Document Reviewed: 11/19/2012 Elsevier Interactive Patient Education  [employer].

## 2015-08-31 NOTE — ED Notes (Signed)
Pt c/o bilateral leg edema x2 weeks. States she has intermittent pain in both legs/

## 2015-09-08 ENCOUNTER — Other Ambulatory Visit: Payer: Self-pay | Admitting: Family Medicine

## 2015-10-06 ENCOUNTER — Other Ambulatory Visit: Payer: Self-pay | Admitting: Family Medicine

## 2015-11-30 ENCOUNTER — Telehealth: Payer: Self-pay

## 2015-11-30 DIAGNOSIS — E782 Mixed hyperlipidemia: Secondary | ICD-10-CM

## 2015-11-30 DIAGNOSIS — I10 Essential (primary) hypertension: Secondary | ICD-10-CM

## 2015-11-30 NOTE — Telephone Encounter (Signed)
Patient is coming in for a wellness visit and would like labs. What labs would you like me to order?

## 2015-11-30 NOTE — Telephone Encounter (Signed)
Cbc, CMP, Lipid

## 2015-12-03 NOTE — Telephone Encounter (Signed)
Labs ordered and patient is aware 

## 2015-12-03 NOTE — Addendum Note (Signed)
Addended by: Narda Rutherford on: 12/03/2015 07:31 AM   Modules accepted: Orders

## 2015-12-05 DIAGNOSIS — I10 Essential (primary) hypertension: Secondary | ICD-10-CM | POA: Diagnosis not present

## 2015-12-05 DIAGNOSIS — E782 Mixed hyperlipidemia: Secondary | ICD-10-CM | POA: Diagnosis not present

## 2015-12-05 LAB — CBC
HCT: 37 % (ref 35.0–45.0)
Hemoglobin: 11.5 g/dL — ABNORMAL LOW (ref 11.7–15.5)
MCH: 26.6 pg — ABNORMAL LOW (ref 27.0–33.0)
MCHC: 31.1 g/dL — ABNORMAL LOW (ref 32.0–36.0)
MCV: 85.5 fL (ref 80.0–100.0)
MPV: 10.8 fL (ref 7.5–12.5)
PLATELETS: 256 10*3/uL (ref 140–400)
RBC: 4.33 MIL/uL (ref 3.80–5.10)
RDW: 17.2 % — AB (ref 11.0–15.0)
WBC: 7.8 10*3/uL (ref 3.8–10.8)

## 2015-12-06 LAB — COMPLETE METABOLIC PANEL WITH GFR
ALT: 10 U/L (ref 6–29)
AST: 17 U/L (ref 10–35)
Albumin: 4.2 g/dL (ref 3.6–5.1)
Alkaline Phosphatase: 49 U/L (ref 33–130)
BILIRUBIN TOTAL: 0.7 mg/dL (ref 0.2–1.2)
BUN: 17 mg/dL (ref 7–25)
CALCIUM: 9.4 mg/dL (ref 8.6–10.4)
CO2: 26 mmol/L (ref 20–31)
CREATININE: 1.21 mg/dL — AB (ref 0.60–0.88)
Chloride: 107 mmol/L (ref 98–110)
GFR, EST AFRICAN AMERICAN: 46 mL/min — AB (ref >=60)
GFR, Est Non African American: 40 mL/min — ABNORMAL LOW (ref >=60)
Glucose, Bld: 94 mg/dL (ref 65–99)
Potassium: 4.8 mmol/L (ref 3.5–5.3)
Sodium: 142 mmol/L (ref 135–146)
TOTAL PROTEIN: 7 g/dL (ref 6.1–8.1)

## 2015-12-06 LAB — LIPID PANEL
CHOLESTEROL: 162 mg/dL (ref 125–200)
HDL: 52 mg/dL (ref >=46)
LDL Cholesterol: 89 mg/dL (ref <130)
TRIGLYCERIDES: 105 mg/dL (ref <150)
Total CHOL/HDL Ratio: 3.1 Ratio (ref <=5.0)
VLDL: 21 mg/dL (ref <30)

## 2015-12-08 ENCOUNTER — Other Ambulatory Visit: Payer: Self-pay | Admitting: Family Medicine

## 2015-12-11 ENCOUNTER — Encounter: Payer: Self-pay | Admitting: Family Medicine

## 2015-12-11 ENCOUNTER — Ambulatory Visit (INDEPENDENT_AMBULATORY_CARE_PROVIDER_SITE_OTHER): Payer: Medicare Other | Admitting: Family Medicine

## 2015-12-11 VITALS — BP 136/60 | HR 67 | Wt 144.0 lb

## 2015-12-11 DIAGNOSIS — M7061 Trochanteric bursitis, right hip: Secondary | ICD-10-CM | POA: Diagnosis not present

## 2015-12-11 DIAGNOSIS — Z0000 Encounter for general adult medical examination without abnormal findings: Secondary | ICD-10-CM

## 2015-12-11 DIAGNOSIS — M1711 Unilateral primary osteoarthritis, right knee: Secondary | ICD-10-CM | POA: Diagnosis not present

## 2015-12-11 DIAGNOSIS — M8580 Other specified disorders of bone density and structure, unspecified site: Secondary | ICD-10-CM

## 2015-12-11 DIAGNOSIS — Z23 Encounter for immunization: Secondary | ICD-10-CM

## 2015-12-11 NOTE — Assessment & Plan Note (Signed)
Injection as above. Home health physical therapy. Return as needed.

## 2015-12-11 NOTE — Progress Notes (Addendum)
Subjective:   Tracie Nguyen is a 82 y.o. female who presents for an Initial Medicare Annual Wellness Visit.  She is here today with her daughter-in-law.  Review of Systems    Comprehensive ROS is negative.        Objective:    Today's Vitals   12/11/15 1452  BP: 136/60  Pulse: 67  SpO2: 98%  Weight: 144 lb (65.3 kg)   Body mass index is 23.96 kg/m.   Current Medications (verified) Outpatient Encounter Prescriptions as of 12/11/2015  Medication Sig  . apixaban (ELIQUIS) 5 MG TABS tablet Take 1 tablet (5 mg total) by mouth 2 (two) times daily.  Marland Kitchen diltiazem (TIAZAC) 240 MG 24 hr capsule Take 240 mg by mouth daily.   . diphenoxylate-atropine (LOMOTIL) 2.5-0.025 MG tablet Take by mouth 4 (four) times daily as needed for diarrhea or loose stools.  . furosemide (LASIX) 20 MG tablet Take 1 tablet (20 mg total) by mouth every other day. (Patient taking differently: Take 20 mg by mouth as directed. 1 tablet twice a week)  . gabapentin (NEURONTIN) 600 MG tablet TAKE 1/2 TABLET BY MOUTH EVERY MORNING 1 TABLET AT NOON AND 1 TABLET AT BEDTIME  . metoprolol (LOPRESSOR) 100 MG tablet Take 50 mg by mouth daily.   . Multiple Vitamin (MULTIVITAMIN) tablet Take 2 tablets by mouth daily.   . Omega-3 Fatty Acids (FISH OIL) 1200 MG CAPS Take 2,400 mg by mouth.  . [DISCONTINUED] levothyroxine (SYNTHROID, LEVOTHROID) 137 MCG tablet TAKE 1 TABLET(137 MCG) BY MOUTH DAILY BEFORE BREAKFAST  . [DISCONTINUED] losartan (COZAAR) 100 MG tablet TAKE 1 TABLET(100 MG) BY MOUTH DAILY   No facility-administered encounter medications on file as of 12/11/2015.     Allergies (verified) Review of patient's allergies indicates no known allergies.   History: Past Medical History:  Diagnosis Date  . Atrial fibrillation ([place])   . Hypertension   . Hypothyroid    Past Surgical History:  Procedure Laterality Date  . CATARACT EXTRACTION BILATERAL W/ ANTERIOR VITRECTOMY Right   . TONSILLECTOMY      as a child  . TOTAL HIP ARTHROPLASTY     Family History  Problem Relation Age of Onset  . Stroke Mother   . Heart attack Father    Social History   Occupational History  . Not on file.   Social History Main Topics  . Smoking status: Never Smoker  . Smokeless tobacco: Never Used  . Alcohol use No  . Drug use: No  . Sexual activity: Not Currently    Tobacco Counseling Counseling given: Not Answered   Activities of Daily Living In your present state of health, do you have any difficulty performing the following activities: 12/13/2015  Hearing? Y  Vision? Y  Difficulty concentrating or making decisions? Y  Walking or climbing stairs? Y  Dressing or bathing? N  Doing errands, shopping? Y  Some recent data might be hidden    Immunizations and Health Maintenance Immunization History  Administered Date(s) Administered  . H1N1 02/14/2008  . Influenza, High Dose Seasonal PF 12/11/2015  . Influenza,inj,Quad PF,36+ Mos 11/23/2014  . Pneumococcal Conjugate-13 12/11/2015  . Tdap 11/23/2014   Health Maintenance Due  Topic Date Due  . ZOSTAVAX  06/28/1987  . DEXA SCAN  06/27/1992   Physical Exam  Constitutional: She is oriented to person, place, and time and well-developed, well-nourished, and in no distress.  HENT:  Head: Normocephalic and atraumatic.  Nose: Nose normal.  Mouth/Throat: Oropharynx is  clear and moist.  Bilat canals blocked by cerumen.   Neck: Neck supple. No thyromegaly present.  Cardiovascular: Normal rate, regular rhythm and normal heart sounds.   Respiratory: Effort normal and breath sounds normal. No respiratory distress. She has no wheezes.  GI: Soft. Bowel sounds are normal. She exhibits no distension. There is no tenderness.  Musculoskeletal:  Right knee with swelling. Tender along medial joint line and just medial to the patella superiorly.  Tender over the right greater trochanter.    Neurological: She is alert and oriented to person, place,  and time.  Skin: Skin is warm and dry. No rash noted.  Psychiatric: Affect and judgment normal.    Physical Exam  Constitutional: She is oriented to person, place, and time and well-developed, well-nourished, and in no distress.  HENT:  Head: Normocephalic and atraumatic.  Nose: Nose normal.  Mouth/Throat: Oropharynx is clear and moist.  Bilat canals blocked by cerumen.   Neck: Neck supple. No thyromegaly present.  Cardiovascular: Normal rate, regular rhythm and normal heart sounds.   Pulmonary/Chest: Effort normal and breath sounds normal. No respiratory distress. She has no wheezes.  Abdominal: Soft. Bowel sounds are normal. She exhibits no distension. There is no tenderness.  Musculoskeletal:  Right knee with swelling. Tender along medial joint line and just medial to the patella superiorly.  Tender over the right greater trochanter.    Neurological: She is alert and oriented to person, place, and time.  Skin: Skin is warm and dry. No rash noted.  Psychiatric: Affect and judgment normal.    Patient Care Team: Hali Marry, MD as PCP - General (Family Medicine) Lelon Perla, MD as Consulting Physician (Cardiology)  Indicate any recent Medical Services you may have received from other than [facility] providers in the past year (date may be approximate).     Assessment:   This is a routine wellness examination for [place].  Physical Exam  Constitutional: She is oriented to person, place, and time and well-developed, well-nourished, and in no distress.  HENT:  Head: Normocephalic and atraumatic.  Nose: Nose normal.  Mouth/Throat: Oropharynx is clear and moist.  Bilat canals blocked by cerumen.   Neck: Neck supple. No thyromegaly present.  Cardiovascular: Normal rate, regular rhythm and normal heart sounds.   Pulmonary/Chest: Effort normal and breath sounds normal. No respiratory distress. She has no wheezes.  Abdominal: Soft. Bowel sounds are normal. She exhibits no  distension. There is no tenderness.  Musculoskeletal:  Right knee with swelling. Tender along medial joint line and just medial to the patella superiorly.  Tender over the right greater trochanter.    Neurological: She is alert and oriented to person, place, and time.  Skin: Skin is warm and dry. No rash noted.  Psychiatric: Affect and judgment normal.     Hearing/Vision screen No exam data present  Dietary issues and exercise activities discussed: Current Exercise Habits: Home exercise routine, Type of exercise: walking, Frequency (Times/Week): 7, Intensity: Mild  Goals    None     Depression Screen PHQ 2/9 Scores 12/11/2015 12/11/2015 01/03/2015 12/04/2014  PHQ - 2 Score 3 3 0 0  PHQ- 9 Score 9 9 - -    Fall Risk Fall Risk  12/11/2015 01/03/2015 12/04/2014  Falls in the past year? Yes Yes Yes  Number falls in past yr: - 1 1  Injury with Fall? - No Yes  Risk for fall due to : - - Impaired balance/gait  Follow up - Follow up appointment  Falls prevention discussed    Cognitive Function:     6CIT Screen 12/11/2015  What Year? 0 points  What month? 0 points  What time? 0 points  Count back from 20 0 points  Months in reverse 2 points  Repeat phrase 8 points  Total Score 10    Screening Tests Health Maintenance  Topic Date Due  . ZOSTAVAX  06/28/1987  . DEXA SCAN  06/27/1992  . PNA vac Low Risk Adult (2 of 2 - PPSV23) 12/10/2016  . TETANUS/TDAP  11/22/2024  . INFLUENZA VACCINE  Completed      Plan:   Medicare wellness exam  During the course of the visit, [place] was educated and counseled about the following appropriate screening and preventive services:   Vaccines to include Pneumoccal, Influenza, Hepatitis B, Td, Zostavax, HCV  Cardiovascular disease screening  Colorectal cancer screening  Bone density screening - ordered.    Diabetes screening  Glaucoma screening  Mammography/PAP  Nutrition counseling   Osteopenia-not sure she has a  previous diagnosis of osteoporosis or osteopenia. She said she was given a Reclast infusion several years ago but only had it done once. She says she has not had a bone density and at least 5 years if not longer. Will get this scheduled.  Patient Instructions (the written plan) were given to the patient.    Dianna Ewald, MD   12/13/2015

## 2015-12-11 NOTE — Patient Instructions (Signed)
Keep up a regular exercise program and make sure you are eating a healthy diet Try to eat 4 servings of dairy a day, or if you are lactose intolerant take a calcium with vitamin D daily.    

## 2015-12-11 NOTE — Progress Notes (Signed)
Subjective:    I'm seeing this patient as a consultation for:  Dr. Beatrice Lecher  CC: Right knee and right hip pain  HPI: This is a pleasant 82 year old female, here for a couple of complaints.   Right knee osteoarthritis: Previous injection was a long time ago, now having a recurrence of pain and swelling localized at the medial joint line, moderate, persistent without radiation, desires repeat interventional treatment today.  Right hip pain: Localized over the greater trochanteric bursa. Has not had an injection, desires interventional treatment today, pain is moderate, persistent, localized without radiation.   Past medical history:  Negative.  See flowsheet/record as well for more information.  Surgical history: Negative.  See flowsheet/record as well for more information.  Family history: Negative.  See flowsheet/record as well for more information.  Social history: Negative.  See flowsheet/record as well for more information.  Allergies, and medications have been entered into the medical record, reviewed, and no changes needed.   Review of Systems: No headache, visual changes, nausea, vomiting, diarrhea, constipation, dizziness, abdominal pain, skin rash, fevers, chills, night sweats, weight loss, swollen lymph nodes, body aches, joint swelling, muscle aches, chest pain, shortness of breath, mood changes, visual or auditory hallucinations.   Objective:   General: Well Developed, well nourished, and in no acute distress.  Neuro/Psych: Alert and oriented x3, extra-ocular muscles intact, able to move all 4 extremities, sensation grossly intact. Skin: Warm and dry, no rashes noted.  Respiratory: Not using accessory muscles, speaking in full sentences, trachea midline.  Cardiovascular: Pulses palpable, no extremity edema. Abdomen: Does not appear distended. Right Knee: Visibly swollen with a palpable fluid wave and tenderness at the medial joint line ROM normal in flexion and  extension and lower leg rotation. Ligaments with solid consistent endpoints including ACL, PCL, LCL, MCL. Negative Mcmurray's and provocative meniscal tests. Non painful patellar compression. Patellar and quadriceps tendons unremarkable. Hamstring and quadriceps strength is normal. Right Hip: ROM IR: 60 Deg, ER: 60 Deg, Flexion: 120 Deg, Extension: 100 Deg, Abduction: 45 Deg, Adduction: 45 Deg Strength IR: 5/5, ER: 5/5, Flexion: 5/5, Extension: 5/5, Abduction: 5/5, Adduction: 5/5 Pelvic alignment unremarkable to inspection and palpation. Standing hip rotation and gait without trendelenburg / unsteadiness. Greater trochanter with tenderness to palpation. No tenderness over piriformis. No SI joint tenderness and normal minimal SI movement.  Procedure: Real-time Ultrasound Guided aspiration/Injection of right knee Device: GE Logiq E  Verbal informed consent obtained.  Time-out conducted.  Noted no overlying erythema, induration, or other signs of local infection.  Skin prepped in a sterile fashion.  Local anesthesia: Topical Ethyl chloride.  With sterile technique and under real time ultrasound guidance:  Aspirated 10 mL straw-colored fluid, syringe switched and 1 mL kenalog 40, 2 mL lidocaine, 2 mL Marcaine injected easily. Completed without difficulty  Pain immediately resolved suggesting accurate placement of the medication.  Advised to call if fevers/chills, erythema, induration, drainage, or persistent bleeding.  Images permanently stored and available for review in the ultrasound unit.  Impression: Technically successful ultrasound guided injection.  Procedure: Real-time Ultrasound Guided Injection of right greater trochanteric bursa Device: GE Logiq E  Verbal informed consent obtained.  Time-out conducted.  Noted no overlying erythema, induration, or other signs of local infection.  Skin prepped in a sterile fashion.  Local anesthesia: Topical Ethyl chloride.  With sterile  technique and under real time ultrasound guidance:  Using a spinal needle advanced to the greater trochanter, and injected 1 mL kenalog 40, 2 mL  lidocaine, 2 mL Marcaine. Completed without difficulty  Pain immediately resolved suggesting accurate placement of the medication.  Advised to call if fevers/chills, erythema, induration, drainage, or persistent bleeding.  Images permanently stored and available for review in the ultrasound unit.  Impression: Technically successful ultrasound guided injection.  Impression and Recommendations:   This case required medical decision making of moderate complexity.  Primary osteoarthritis of right knee Aspiration and injection as above. Return in one month, viscous supplementation if no better.  Greater trochanteric bursitis, right Injection as above. Home health physical therapy. Return as needed.

## 2015-12-11 NOTE — Assessment & Plan Note (Signed)
Aspiration and injection as above. Return in one month, viscous supplementation if no better.

## 2015-12-13 ENCOUNTER — Telehealth: Payer: Self-pay | Admitting: Family Medicine

## 2015-12-13 DIAGNOSIS — I4891 Unspecified atrial fibrillation: Secondary | ICD-10-CM | POA: Diagnosis not present

## 2015-12-13 DIAGNOSIS — Z96641 Presence of right artificial hip joint: Secondary | ICD-10-CM | POA: Diagnosis not present

## 2015-12-13 DIAGNOSIS — I10 Essential (primary) hypertension: Secondary | ICD-10-CM | POA: Diagnosis not present

## 2015-12-13 DIAGNOSIS — Z7901 Long term (current) use of anticoagulants: Secondary | ICD-10-CM | POA: Diagnosis not present

## 2015-12-13 DIAGNOSIS — E039 Hypothyroidism, unspecified: Secondary | ICD-10-CM | POA: Diagnosis not present

## 2015-12-13 DIAGNOSIS — M7061 Trochanteric bursitis, right hip: Secondary | ICD-10-CM | POA: Diagnosis not present

## 2015-12-13 DIAGNOSIS — M1711 Unilateral primary osteoarthritis, right knee: Secondary | ICD-10-CM | POA: Diagnosis not present

## 2015-12-13 NOTE — Telephone Encounter (Signed)
Spoke w/pt's daughter and she stated that she has spoken to her mother about this and she stated that she doesn't feel depressed she just sits in her room and watches tv. Will fwd to pcp for review.Audelia Hives [place]

## 2015-12-13 NOTE — Telephone Encounter (Signed)
Please call patient: On one of her Medicare wellness forms it looks like she did have some signs of depression. If she would like to discuss this further please see if she would be willing to come back in for an appointment. But certainly has completely up to her.

## 2015-12-15 ENCOUNTER — Other Ambulatory Visit: Payer: Self-pay | Admitting: Family Medicine

## 2015-12-19 ENCOUNTER — Ambulatory Visit (INDEPENDENT_AMBULATORY_CARE_PROVIDER_SITE_OTHER): Payer: Medicare Other

## 2015-12-19 DIAGNOSIS — M1711 Unilateral primary osteoarthritis, right knee: Secondary | ICD-10-CM | POA: Diagnosis not present

## 2015-12-19 DIAGNOSIS — I10 Essential (primary) hypertension: Secondary | ICD-10-CM | POA: Diagnosis not present

## 2015-12-19 DIAGNOSIS — M85851 Other specified disorders of bone density and structure, right thigh: Secondary | ICD-10-CM | POA: Diagnosis not present

## 2015-12-19 DIAGNOSIS — Z7901 Long term (current) use of anticoagulants: Secondary | ICD-10-CM | POA: Diagnosis not present

## 2015-12-19 DIAGNOSIS — M810 Age-related osteoporosis without current pathological fracture: Secondary | ICD-10-CM | POA: Diagnosis not present

## 2015-12-19 DIAGNOSIS — E039 Hypothyroidism, unspecified: Secondary | ICD-10-CM | POA: Diagnosis not present

## 2015-12-19 DIAGNOSIS — M7061 Trochanteric bursitis, right hip: Secondary | ICD-10-CM | POA: Diagnosis not present

## 2015-12-19 DIAGNOSIS — I4891 Unspecified atrial fibrillation: Secondary | ICD-10-CM | POA: Diagnosis not present

## 2015-12-20 ENCOUNTER — Encounter: Payer: Self-pay | Admitting: Family Medicine

## 2015-12-20 DIAGNOSIS — M810 Age-related osteoporosis without current pathological fracture: Secondary | ICD-10-CM

## 2015-12-20 HISTORY — DX: Age-related osteoporosis without current pathological fracture: M81.0

## 2015-12-21 DIAGNOSIS — M7061 Trochanteric bursitis, right hip: Secondary | ICD-10-CM | POA: Diagnosis not present

## 2015-12-21 DIAGNOSIS — E039 Hypothyroidism, unspecified: Secondary | ICD-10-CM | POA: Diagnosis not present

## 2015-12-21 DIAGNOSIS — I10 Essential (primary) hypertension: Secondary | ICD-10-CM | POA: Diagnosis not present

## 2015-12-21 DIAGNOSIS — I4891 Unspecified atrial fibrillation: Secondary | ICD-10-CM | POA: Diagnosis not present

## 2015-12-21 DIAGNOSIS — Z7901 Long term (current) use of anticoagulants: Secondary | ICD-10-CM | POA: Diagnosis not present

## 2015-12-21 DIAGNOSIS — M1711 Unilateral primary osteoarthritis, right knee: Secondary | ICD-10-CM | POA: Diagnosis not present

## 2015-12-24 DIAGNOSIS — I10 Essential (primary) hypertension: Secondary | ICD-10-CM | POA: Diagnosis not present

## 2015-12-24 DIAGNOSIS — I4891 Unspecified atrial fibrillation: Secondary | ICD-10-CM | POA: Diagnosis not present

## 2015-12-24 DIAGNOSIS — Z7901 Long term (current) use of anticoagulants: Secondary | ICD-10-CM | POA: Diagnosis not present

## 2015-12-24 DIAGNOSIS — M1711 Unilateral primary osteoarthritis, right knee: Secondary | ICD-10-CM | POA: Diagnosis not present

## 2015-12-24 DIAGNOSIS — M7061 Trochanteric bursitis, right hip: Secondary | ICD-10-CM | POA: Diagnosis not present

## 2015-12-24 DIAGNOSIS — E039 Hypothyroidism, unspecified: Secondary | ICD-10-CM | POA: Diagnosis not present

## 2015-12-26 DIAGNOSIS — I4891 Unspecified atrial fibrillation: Secondary | ICD-10-CM | POA: Diagnosis not present

## 2015-12-26 DIAGNOSIS — E039 Hypothyroidism, unspecified: Secondary | ICD-10-CM | POA: Diagnosis not present

## 2015-12-26 DIAGNOSIS — M1711 Unilateral primary osteoarthritis, right knee: Secondary | ICD-10-CM | POA: Diagnosis not present

## 2015-12-26 DIAGNOSIS — M7061 Trochanteric bursitis, right hip: Secondary | ICD-10-CM | POA: Diagnosis not present

## 2015-12-26 DIAGNOSIS — Z7901 Long term (current) use of anticoagulants: Secondary | ICD-10-CM | POA: Diagnosis not present

## 2015-12-26 DIAGNOSIS — I10 Essential (primary) hypertension: Secondary | ICD-10-CM | POA: Diagnosis not present

## 2015-12-30 DIAGNOSIS — I4891 Unspecified atrial fibrillation: Secondary | ICD-10-CM | POA: Diagnosis not present

## 2015-12-30 DIAGNOSIS — E039 Hypothyroidism, unspecified: Secondary | ICD-10-CM | POA: Diagnosis not present

## 2015-12-30 DIAGNOSIS — I10 Essential (primary) hypertension: Secondary | ICD-10-CM | POA: Diagnosis not present

## 2015-12-30 DIAGNOSIS — M1711 Unilateral primary osteoarthritis, right knee: Secondary | ICD-10-CM | POA: Diagnosis not present

## 2015-12-30 DIAGNOSIS — Z7901 Long term (current) use of anticoagulants: Secondary | ICD-10-CM | POA: Diagnosis not present

## 2015-12-30 DIAGNOSIS — M7061 Trochanteric bursitis, right hip: Secondary | ICD-10-CM | POA: Diagnosis not present

## 2015-12-31 ENCOUNTER — Ambulatory Visit: Payer: Medicare Other

## 2015-12-31 ENCOUNTER — Other Ambulatory Visit: Payer: Self-pay | Admitting: Family Medicine

## 2015-12-31 DIAGNOSIS — M810 Age-related osteoporosis without current pathological fracture: Secondary | ICD-10-CM

## 2016-01-01 LAB — COMPLETE METABOLIC PANEL WITH GFR
ALT: 14 U/L (ref 6–29)
AST: 16 U/L (ref 10–35)
Albumin: 4 g/dL (ref 3.6–5.1)
Alkaline Phosphatase: 39 U/L (ref 33–130)
BILIRUBIN TOTAL: 0.4 mg/dL (ref 0.2–1.2)
BUN: 24 mg/dL (ref 7–25)
CALCIUM: 9.6 mg/dL (ref 8.6–10.4)
CHLORIDE: 109 mmol/L (ref 98–110)
CO2: 30 mmol/L (ref 20–31)
Creat: 1.18 mg/dL — ABNORMAL HIGH (ref 0.60–0.88)
GFR, EST AFRICAN AMERICAN: 48 mL/min — AB (ref >=60)
GFR, EST NON AFRICAN AMERICAN: 41 mL/min — AB (ref >=60)
Glucose, Bld: 104 mg/dL — ABNORMAL HIGH (ref 65–99)
Potassium: 4.7 mmol/L (ref 3.5–5.3)
Sodium: 145 mmol/L (ref 135–146)
TOTAL PROTEIN: 6.4 g/dL (ref 6.1–8.1)

## 2016-01-03 ENCOUNTER — Other Ambulatory Visit: Payer: Self-pay | Admitting: Family Medicine

## 2016-01-07 DIAGNOSIS — E039 Hypothyroidism, unspecified: Secondary | ICD-10-CM | POA: Diagnosis not present

## 2016-01-07 DIAGNOSIS — I4891 Unspecified atrial fibrillation: Secondary | ICD-10-CM | POA: Diagnosis not present

## 2016-01-07 DIAGNOSIS — Z7901 Long term (current) use of anticoagulants: Secondary | ICD-10-CM | POA: Diagnosis not present

## 2016-01-07 DIAGNOSIS — I10 Essential (primary) hypertension: Secondary | ICD-10-CM | POA: Diagnosis not present

## 2016-01-07 DIAGNOSIS — M7061 Trochanteric bursitis, right hip: Secondary | ICD-10-CM | POA: Diagnosis not present

## 2016-01-07 DIAGNOSIS — M1711 Unilateral primary osteoarthritis, right knee: Secondary | ICD-10-CM | POA: Diagnosis not present

## 2016-01-08 ENCOUNTER — Ambulatory Visit: Payer: Medicare Other | Admitting: Family Medicine

## 2016-01-08 ENCOUNTER — Encounter: Payer: Self-pay | Admitting: Sports Medicine

## 2016-01-08 ENCOUNTER — Ambulatory Visit (INDEPENDENT_AMBULATORY_CARE_PROVIDER_SITE_OTHER): Payer: Medicare Other | Admitting: Sports Medicine

## 2016-01-08 ENCOUNTER — Other Ambulatory Visit: Payer: Self-pay

## 2016-01-08 DIAGNOSIS — M7061 Trochanteric bursitis, right hip: Secondary | ICD-10-CM

## 2016-01-08 DIAGNOSIS — H6123 Impacted cerumen, bilateral: Secondary | ICD-10-CM

## 2016-01-08 DIAGNOSIS — M1711 Unilateral primary osteoarthritis, right knee: Secondary | ICD-10-CM | POA: Diagnosis not present

## 2016-01-08 DIAGNOSIS — N183 Chronic kidney disease, stage 3 unspecified: Secondary | ICD-10-CM

## 2016-01-08 DIAGNOSIS — M25552 Pain in left hip: Secondary | ICD-10-CM

## 2016-01-08 MED ORDER — GABAPENTIN 600 MG PO TABS: ORAL_TABLET | ORAL | 0 refills | Status: DC

## 2016-01-08 NOTE — Assessment & Plan Note (Signed)
Multifactorial, referable to both the joint and the trochanteric bursa. Hip joint injection today, x-rays, return to see me in one month for this.

## 2016-01-08 NOTE — Assessment & Plan Note (Signed)
Only had a temporary response to steroid injection at the last visit, starting Visco supplementation. Return in one week for Orthovisc injection #2 of 4

## 2016-01-08 NOTE — Progress Notes (Signed)
Subjective:    CC: Follow-up  HPI: Right knee osteoarthritis: Temporary response to steroid injection at the last visit, agreeable to proceed with viscous supplementation today.  Right hip bursitis: Resolved after injection  Left hip pain: Multifactorial with tenderness over the greater trochanter as well as in the groin. Severe, persistent without radiation.  Past medical history:  Negative.  See flowsheet/record as well for more information.  Surgical history: Negative.  See flowsheet/record as well for more information.  Family history: Negative.  See flowsheet/record as well for more information.  Social history: Negative.  See flowsheet/record as well for more information.  Allergies, and medications have been entered into the medical record, reviewed, and no changes needed.   Review of Systems: No fevers, chills, night sweats, weight loss, chest pain, or shortness of breath.   Objective:    General: Well Developed, well nourished, and in no acute distress.  Neuro: Alert and oriented x3, extra-ocular muscles intact, sensation grossly intact.  HEENT: Normocephalic, atraumatic, pupils equal round reactive to light, neck supple, no masses, no lymphadenopathy, thyroid nonpalpable.  Skin: Warm and dry, no rashes. Cardiac: Regular rate and rhythm, no murmurs rubs or gallops, no lower extremity edema.  Respiratory: Clear to auscultation bilaterally. Not using accessory muscles, speaking in full sentences. Left Hip: ROM IR: 10 Deg, with reproduction of severe pain, ER: 60 Deg, Flexion: 120 Deg, Extension: 100 Deg, Abduction: 45 Deg, Adduction: 45 Deg Strength IR: 5/5, ER: 5/5, Flexion: 5/5, Extension: 5/5, Abduction: 5/5, Adduction: 5/5 Pelvic alignment unremarkable to inspection and palpation. Standing hip rotation and gait without trendelenburg / unsteadiness. Greater trochanter with only mild tenderness to palpation. No tenderness over piriformis. No SI joint tenderness and  normal minimal SI movement.  Procedure: Real-time Ultrasound Guided Injection of  right knee Device: GE Logiq E  Verbal informed consent obtained.  Time-out conducted.  Noted no overlying erythema, induration, or other signs of local infection.  Skin prepped in a sterile fashion.  Local anesthesia: Topical Ethyl chloride.  With sterile technique and under real time ultrasound guidance:   30 mg/2 mL of OrthoVisc (sodium hyaluronate) in a prefilled syringe was injected easily into the knee through a 22-gauge needle. Completed without difficulty  Pain immediately resolved suggesting accurate placement of the medication.  Advised to call if fevers/chills, erythema, induration, drainage, or persistent bleeding.  Images permanently stored and available for review in the ultrasound unit.  Impression: Technically successful ultrasound guided injection.  Procedure: Real-time Ultrasound Guided Injection of left hip joint Device: GE Logiq E  Verbal informed consent obtained.  Time-out conducted.  Noted no overlying erythema, induration, or other signs of local infection.  Skin prepped in a sterile fashion.  Local anesthesia: Topical Ethyl chloride.  With sterile technique and under real time ultrasound guidance:  Noted hip joint effusion, spinal needle advanced, 1 mL kenalog 40, 2 mL lidocaine, 2 mL Marcaine injected easily Completed without difficulty  Pain immediately resolved suggesting accurate placement of the medication.  Advised to call if fevers/chills, erythema, induration, drainage, or persistent bleeding.  Images permanently stored and available for review in the ultrasound unit.  Impression: Technically successful ultrasound guided injection.  Impression and Recommendations:    Primary osteoarthritis of right knee Only had a temporary response to steroid injection at the last visit, starting Visco supplementation. Return in one week for Orthovisc injection #2 of 4  Greater  trochanteric bursitis, right Resolved with an injection at the last visit, currently doing home health physical therapy.  Left hip pain Multifactorial, referable to both the joint and the trochanteric bursa. Hip joint injection today, x-rays, return to see me in one month for this.

## 2016-01-08 NOTE — Assessment & Plan Note (Signed)
Resolved with an injection at the last visit, currently doing home health physical therapy.

## 2016-01-09 DIAGNOSIS — M1711 Unilateral primary osteoarthritis, right knee: Secondary | ICD-10-CM | POA: Diagnosis not present

## 2016-01-09 DIAGNOSIS — M7061 Trochanteric bursitis, right hip: Secondary | ICD-10-CM | POA: Diagnosis not present

## 2016-01-09 DIAGNOSIS — I4891 Unspecified atrial fibrillation: Secondary | ICD-10-CM | POA: Diagnosis not present

## 2016-01-09 DIAGNOSIS — I10 Essential (primary) hypertension: Secondary | ICD-10-CM | POA: Diagnosis not present

## 2016-01-09 DIAGNOSIS — Z7901 Long term (current) use of anticoagulants: Secondary | ICD-10-CM | POA: Diagnosis not present

## 2016-01-09 DIAGNOSIS — E039 Hypothyroidism, unspecified: Secondary | ICD-10-CM | POA: Diagnosis not present

## 2016-01-14 DIAGNOSIS — I10 Essential (primary) hypertension: Secondary | ICD-10-CM | POA: Diagnosis not present

## 2016-01-14 DIAGNOSIS — M7061 Trochanteric bursitis, right hip: Secondary | ICD-10-CM | POA: Diagnosis not present

## 2016-01-14 DIAGNOSIS — I4891 Unspecified atrial fibrillation: Secondary | ICD-10-CM | POA: Diagnosis not present

## 2016-01-14 DIAGNOSIS — Z7901 Long term (current) use of anticoagulants: Secondary | ICD-10-CM | POA: Diagnosis not present

## 2016-01-14 DIAGNOSIS — E039 Hypothyroidism, unspecified: Secondary | ICD-10-CM | POA: Diagnosis not present

## 2016-01-14 DIAGNOSIS — M1711 Unilateral primary osteoarthritis, right knee: Secondary | ICD-10-CM | POA: Diagnosis not present

## 2016-01-15 ENCOUNTER — Ambulatory Visit (INDEPENDENT_AMBULATORY_CARE_PROVIDER_SITE_OTHER): Payer: Medicare Other | Admitting: Sports Medicine

## 2016-01-15 ENCOUNTER — Encounter: Payer: Self-pay | Admitting: Sports Medicine

## 2016-01-15 DIAGNOSIS — M1711 Unilateral primary osteoarthritis, right knee: Secondary | ICD-10-CM

## 2016-01-15 DIAGNOSIS — M25552 Pain in left hip: Secondary | ICD-10-CM

## 2016-01-15 NOTE — Progress Notes (Signed)

## 2016-01-15 NOTE — Assessment & Plan Note (Signed)
50% better after hip joint injection, still has some pain referable to the bursa.

## 2016-01-15 NOTE — Assessment & Plan Note (Signed)
Orthovisc injection #2 into the right knee, return in one week for #3

## 2016-01-16 DIAGNOSIS — M7061 Trochanteric bursitis, right hip: Secondary | ICD-10-CM | POA: Diagnosis not present

## 2016-01-16 DIAGNOSIS — M1711 Unilateral primary osteoarthritis, right knee: Secondary | ICD-10-CM | POA: Diagnosis not present

## 2016-01-16 DIAGNOSIS — Z7901 Long term (current) use of anticoagulants: Secondary | ICD-10-CM | POA: Diagnosis not present

## 2016-01-16 DIAGNOSIS — E039 Hypothyroidism, unspecified: Secondary | ICD-10-CM | POA: Diagnosis not present

## 2016-01-16 DIAGNOSIS — I10 Essential (primary) hypertension: Secondary | ICD-10-CM | POA: Diagnosis not present

## 2016-01-16 DIAGNOSIS — I4891 Unspecified atrial fibrillation: Secondary | ICD-10-CM | POA: Diagnosis not present

## 2016-01-17 ENCOUNTER — Ambulatory Visit: Payer: Self-pay | Admitting: Sports Medicine

## 2016-01-21 DIAGNOSIS — M1711 Unilateral primary osteoarthritis, right knee: Secondary | ICD-10-CM | POA: Diagnosis not present

## 2016-01-21 DIAGNOSIS — Z7901 Long term (current) use of anticoagulants: Secondary | ICD-10-CM | POA: Diagnosis not present

## 2016-01-21 DIAGNOSIS — E039 Hypothyroidism, unspecified: Secondary | ICD-10-CM | POA: Diagnosis not present

## 2016-01-21 DIAGNOSIS — I10 Essential (primary) hypertension: Secondary | ICD-10-CM | POA: Diagnosis not present

## 2016-01-21 DIAGNOSIS — I4891 Unspecified atrial fibrillation: Secondary | ICD-10-CM | POA: Diagnosis not present

## 2016-01-21 DIAGNOSIS — M7061 Trochanteric bursitis, right hip: Secondary | ICD-10-CM | POA: Diagnosis not present

## 2016-01-22 ENCOUNTER — Ambulatory Visit (INDEPENDENT_AMBULATORY_CARE_PROVIDER_SITE_OTHER): Payer: Medicare Other | Admitting: Sports Medicine

## 2016-01-22 ENCOUNTER — Encounter: Payer: Self-pay | Admitting: Sports Medicine

## 2016-01-22 DIAGNOSIS — M1711 Unilateral primary osteoarthritis, right knee: Secondary | ICD-10-CM

## 2016-01-22 NOTE — Assessment & Plan Note (Signed)
Orthovisc injection #3 into the right knee, return in one week for #4.

## 2016-01-22 NOTE — Progress Notes (Signed)

## 2016-01-23 DIAGNOSIS — I4891 Unspecified atrial fibrillation: Secondary | ICD-10-CM | POA: Diagnosis not present

## 2016-01-23 DIAGNOSIS — Z7901 Long term (current) use of anticoagulants: Secondary | ICD-10-CM | POA: Diagnosis not present

## 2016-01-23 DIAGNOSIS — M7061 Trochanteric bursitis, right hip: Secondary | ICD-10-CM | POA: Diagnosis not present

## 2016-01-23 DIAGNOSIS — M1711 Unilateral primary osteoarthritis, right knee: Secondary | ICD-10-CM | POA: Diagnosis not present

## 2016-01-23 DIAGNOSIS — E039 Hypothyroidism, unspecified: Secondary | ICD-10-CM | POA: Diagnosis not present

## 2016-01-23 DIAGNOSIS — I10 Essential (primary) hypertension: Secondary | ICD-10-CM | POA: Diagnosis not present

## 2016-01-29 ENCOUNTER — Ambulatory Visit (INDEPENDENT_AMBULATORY_CARE_PROVIDER_SITE_OTHER): Payer: Medicare Other | Admitting: Sports Medicine

## 2016-01-29 DIAGNOSIS — M170 Bilateral primary osteoarthritis of knee: Secondary | ICD-10-CM | POA: Diagnosis not present

## 2016-01-29 NOTE — Assessment & Plan Note (Signed)
Orthovisc injection #4 of 4 into the right knee,currently pain-free. Starting series in the left knee, injection #1 of 4 into the left knee, return in one week for #2.

## 2016-01-29 NOTE — Progress Notes (Signed)
  Procedure: Real-time Ultrasound Guided Injection of right knee Device: GE Logiq E  Verbal informed consent obtained.  Time-out conducted.  Noted no overlying erythema, induration, or other signs of local infection.  Skin prepped in a sterile fashion.  Local anesthesia: Topical Ethyl chloride.  With sterile technique and under real time ultrasound guidance:   30 mg/2 mL of OrthoVisc (sodium hyaluronate) in a prefilled syringe was injected easily into the knee through a 22-gauge needle. Completed without difficulty  Pain immediately resolved suggesting accurate placement of the medication.  Advised to call if fevers/chills, erythema, induration, drainage, or persistent bleeding.  Images permanently stored and available for review in the ultrasound unit.  Impression: Technically successful ultrasound guided injection.  Procedure: Real-time Ultrasound Guided Injection of Left knee Device: GE Logiq E  Verbal informed consent obtained.  Time-out conducted.  Noted no overlying erythema, induration, or other signs of local infection.  Skin prepped in a sterile fashion.  Local anesthesia: Topical Ethyl chloride.  With sterile technique and under real time ultrasound guidance:   30 mg/2 mL of OrthoVisc (sodium hyaluronate) in a prefilled syringe was injected easily into the knee through a 22-gauge needle. Completed without difficulty  Pain immediately resolved suggesting accurate placement of the medication.  Advised to call if fevers/chills, erythema, induration, drainage, or persistent bleeding.  Images permanently stored and available for review in the ultrasound unit.  Impression: Technically successful ultrasound guided injection.

## 2016-02-05 ENCOUNTER — Ambulatory Visit (INDEPENDENT_AMBULATORY_CARE_PROVIDER_SITE_OTHER): Payer: Medicare Other | Admitting: Sports Medicine

## 2016-02-05 DIAGNOSIS — M170 Bilateral primary osteoarthritis of knee: Secondary | ICD-10-CM | POA: Diagnosis not present

## 2016-02-05 MED ORDER — TRAMADOL HCL 50 MG PO TABS: ORAL_TABLET | ORAL | 0 refills | Status: DC

## 2016-02-05 NOTE — Progress Notes (Signed)

## 2016-02-05 NOTE — Assessment & Plan Note (Signed)
Finished series in the right knee, injection #2 of 4 into the left knee, return in one week for #3. Starting to complain of some pain in the right knee, was pain-free last week. Adding tramadol, currently on Eliquis so cannot use NSAIDs.

## 2016-02-06 ENCOUNTER — Other Ambulatory Visit: Payer: Self-pay | Admitting: Family Medicine

## 2016-02-06 DIAGNOSIS — M810 Age-related osteoporosis without current pathological fracture: Secondary | ICD-10-CM

## 2016-02-06 NOTE — Progress Notes (Signed)
Lab order to be completed prior to Prolia injection.

## 2016-02-08 LAB — COMPLETE METABOLIC PANEL WITH GFR
ALT: 15 U/L (ref 6–29)
AST: 15 U/L (ref 10–35)
Albumin: 4.1 g/dL (ref 3.6–5.1)
Alkaline Phosphatase: 41 U/L (ref 33–130)
BUN: 25 mg/dL (ref 7–25)
CHLORIDE: 109 mmol/L (ref 98–110)
CO2: 21 mmol/L (ref 20–31)
Calcium: 8.8 mg/dL (ref 8.6–10.4)
Creat: 0.96 mg/dL — ABNORMAL HIGH (ref 0.60–0.88)
GFR, EST NON AFRICAN AMERICAN: 53 mL/min — AB (ref >=60)
GFR, Est African American: 61 mL/min (ref >=60)
GLUCOSE: 160 mg/dL — AB (ref 65–99)
POTASSIUM: 4.4 mmol/L (ref 3.5–5.3)
SODIUM: 143 mmol/L (ref 135–146)
Total Bilirubin: 0.3 mg/dL (ref 0.2–1.2)
Total Protein: 6.5 g/dL (ref 6.1–8.1)

## 2016-02-14 ENCOUNTER — Ambulatory Visit (INDEPENDENT_AMBULATORY_CARE_PROVIDER_SITE_OTHER): Payer: Medicare Other | Admitting: Sports Medicine

## 2016-02-14 ENCOUNTER — Encounter: Payer: Self-pay | Admitting: Family Medicine

## 2016-02-14 ENCOUNTER — Encounter: Payer: Self-pay | Admitting: Sports Medicine

## 2016-02-14 VITALS — BP 122/70 | HR 109 | Ht 65.0 in | Wt 150.0 lb

## 2016-02-14 DIAGNOSIS — M170 Bilateral primary osteoarthritis of knee: Secondary | ICD-10-CM

## 2016-02-14 DIAGNOSIS — R7301 Impaired fasting glucose: Secondary | ICD-10-CM | POA: Diagnosis not present

## 2016-02-14 DIAGNOSIS — M810 Age-related osteoporosis without current pathological fracture: Secondary | ICD-10-CM | POA: Diagnosis not present

## 2016-02-14 LAB — POCT GLYCOSYLATED HEMOGLOBIN (HGB A1C): Hemoglobin A1C: 5.8

## 2016-02-14 MED ORDER — DENOSUMAB 60 MG/ML ~~LOC~~ SOLN: 60.0000 mg | Freq: Once | SUBCUTANEOUS | 0 refills | Status: DC

## 2016-02-14 MED ORDER — DENOSUMAB 60 MG/ML ~~LOC~~ SOLN
60.0000 mg | Freq: Once | SUBCUTANEOUS | Status: AC
  Administered 2016-02-14: 60 mg via SUBCUTANEOUS

## 2016-02-14 MED ORDER — ACETAMINOPHEN ER 650 MG PO TBCR: 1300.0000 mg | EXTENDED_RELEASE_TABLET | Freq: Three times a day (TID) | ORAL | 3 refills | Status: DC | PRN

## 2016-02-14 NOTE — Assessment & Plan Note (Signed)
Finished Orthovisc in the right side, still hurting. Orthovisc injection #3 of 4 on the left, left knee is doing well. Continue tramadol but increase to 3 times a day, discontinue all NSAIDs use arthritis strength Tylenol 2 tabs 3 times per day.  Return in one week for injection #4 left knee.,

## 2016-02-14 NOTE — Progress Notes (Signed)

## 2016-02-21 ENCOUNTER — Encounter: Payer: Self-pay | Admitting: Sports Medicine

## 2016-02-21 ENCOUNTER — Ambulatory Visit (INDEPENDENT_AMBULATORY_CARE_PROVIDER_SITE_OTHER): Payer: Medicare Other | Admitting: Sports Medicine

## 2016-02-21 DIAGNOSIS — M170 Bilateral primary osteoarthritis of knee: Secondary | ICD-10-CM

## 2016-02-21 NOTE — Progress Notes (Signed)

## 2016-02-21 NOTE — Assessment & Plan Note (Signed)
Orthovisc is finished in the right side, still had some pain. Orthovisc No. 4 of 4 on the left. Continue tramadol at 3 times a day and use only arthritis strength Tylenol 2 tabs 3 times a day. If continues to have pain we will proceed with surgical referral.

## 2016-03-01 ENCOUNTER — Other Ambulatory Visit: Payer: Self-pay | Admitting: Sports Medicine

## 2016-03-01 DIAGNOSIS — M170 Bilateral primary osteoarthritis of knee: Secondary | ICD-10-CM

## 2016-03-20 ENCOUNTER — Encounter: Payer: Self-pay | Admitting: Sports Medicine

## 2016-03-20 ENCOUNTER — Ambulatory Visit (INDEPENDENT_AMBULATORY_CARE_PROVIDER_SITE_OTHER): Payer: Medicare Other | Admitting: Sports Medicine

## 2016-03-20 DIAGNOSIS — M170 Bilateral primary osteoarthritis of knee: Secondary | ICD-10-CM

## 2016-03-20 MED ORDER — FUROSEMIDE 20 MG PO TABS: 20.0000 mg | ORAL_TABLET | Freq: Two times a day (BID) | ORAL | Status: DC

## 2016-03-20 MED ORDER — LOSARTAN POTASSIUM 50 MG PO TABS: 50.0000 mg | ORAL_TABLET | Freq: Every day | ORAL | 3 refills | Status: DC

## 2016-03-20 NOTE — Progress Notes (Signed)
  Subjective:    CC: right knee pain  HPI: This is a pleasant 83 year old female, she has bilateral knee osteoarthritis, she did well with a left knee series of Orthovisc, she also had a series of Orthovisc done in her right knee but is continuing to have pain.  Pain is moderate, persistent, localized at the medial joint line and anteriorly without radiation, she does have significant swelling.  Past medical history:  Negative.  See flowsheet/record as well for more information.  Surgical history: Negative.  See flowsheet/record as well for more information.  Family history: Negative.  See flowsheet/record as well for more information.  Social history: Negative.  See flowsheet/record as well for more information.  Allergies, and medications have been entered into the medical record, reviewed, and no changes needed.   Review of Systems: No fevers, chills, night sweats, weight loss, chest pain, or shortness of breath.   Objective:    General: Well Developed, well nourished, and in no acute distress.  Neuro: Alert and oriented x3, extra-ocular muscles intact, sensation grossly intact.  HEENT: Normocephalic, atraumatic, pupils equal round reactive to light, neck supple, no masses, no lymphadenopathy, thyroid nonpalpable.  Skin: Warm and dry, no rashes. Cardiac: Regular rate and rhythm, no murmurs rubs or gallops, no lower extremity edema.  Respiratory: Clear to auscultation bilaterally. Not using accessory muscles, speaking in full sentences. Right Knee: Visibly swollen with palpable fluid wave and tenderness at the medial joint line ROM normal in flexion and extension and lower leg rotation. Ligaments with solid consistent endpoints including ACL, PCL, LCL, MCL. Negative Mcmurray's and provocative meniscal tests. Non painful patellar compression. Patellar and quadriceps tendons unremarkable. Hamstring and quadriceps strength is normal.  Procedure: Real-time Ultrasound Guided  aspiration/injection of right knee Device: GE Logiq E  Verbal informed consent obtained.  Time-out conducted.  Noted no overlying erythema, induration, or other signs of local infection.  Skin prepped in a sterile fashion.  Local anesthesia: Topical Ethyl chloride.  With sterile technique and under real time ultrasound guidance:  Using an 18-gauge needle advanced into the suprapatellar recess, I was able to aspirate 7 mL of straw-colored fluid, syringe switched and 1 mL kenalog 40, 2 mL lidocaine, 2 mL Marcaine injected easily. Completed without difficulty  Pain immediately resolved suggesting accurate placement of the medication.  Advised to call if fevers/chills, erythema, induration, drainage, or persistent bleeding.  Images permanently stored and available for review in the ultrasound unit.  Impression: Technically successful ultrasound guided injection.  Impression and Recommendations:    Primary osteoarthritis of both knees Left knee is doing well after series of Visco supplementation. Right knee is now 2 months after finishing Visco, painful again. Insufficient response to Tylenol and tramadol. I would like some assistance from one of our surgical colleagues. I did drain and inject her right knee today.

## 2016-03-20 NOTE — Assessment & Plan Note (Signed)
Left knee is doing well after series of Visco supplementation. Right knee is now 2 months after finishing Visco, painful again. Insufficient response to Tylenol and tramadol. I would like some assistance from one of our surgical colleagues. I did drain and inject her right knee today.

## 2016-04-12 ENCOUNTER — Other Ambulatory Visit: Payer: Self-pay | Admitting: Family Medicine

## 2016-05-23 ENCOUNTER — Encounter: Payer: Self-pay | Admitting: Family Medicine

## 2016-05-28 ENCOUNTER — Other Ambulatory Visit: Payer: Self-pay

## 2016-05-28 DIAGNOSIS — I10 Essential (primary) hypertension: Secondary | ICD-10-CM

## 2016-05-29 ENCOUNTER — Other Ambulatory Visit: Payer: Self-pay | Admitting: *Deleted

## 2016-05-29 ENCOUNTER — Other Ambulatory Visit: Payer: Self-pay | Admitting: Family Medicine

## 2016-05-29 DIAGNOSIS — I10 Essential (primary) hypertension: Secondary | ICD-10-CM

## 2016-05-29 LAB — BASIC METABOLIC PANEL
BUN: 18 mg/dL (ref 7–25)
CALCIUM: 8.8 mg/dL (ref 8.6–10.4)
CHLORIDE: 109 mmol/L (ref 98–110)
CO2: 26 mmol/L (ref 20–31)
CREATININE: 1.11 mg/dL — AB (ref 0.60–0.88)
Glucose, Bld: 84 mg/dL (ref 65–99)
Potassium: 4.6 mmol/L (ref 3.5–5.3)
Sodium: 142 mmol/L (ref 135–146)

## 2016-06-05 ENCOUNTER — Encounter: Payer: Self-pay | Admitting: Family Medicine

## 2016-06-05 ENCOUNTER — Ambulatory Visit (INDEPENDENT_AMBULATORY_CARE_PROVIDER_SITE_OTHER): Payer: Medicare Other | Admitting: Family Medicine

## 2016-06-05 VITALS — BP 138/69 | HR 67 | Ht 65.0 in | Wt 148.0 lb

## 2016-06-05 DIAGNOSIS — R7301 Impaired fasting glucose: Secondary | ICD-10-CM

## 2016-06-05 DIAGNOSIS — G8929 Other chronic pain: Secondary | ICD-10-CM | POA: Diagnosis not present

## 2016-06-05 DIAGNOSIS — E039 Hypothyroidism, unspecified: Secondary | ICD-10-CM

## 2016-06-05 DIAGNOSIS — M25561 Pain in right knee: Secondary | ICD-10-CM

## 2016-06-05 DIAGNOSIS — I10 Essential (primary) hypertension: Secondary | ICD-10-CM | POA: Diagnosis not present

## 2016-06-05 LAB — POCT GLYCOSYLATED HEMOGLOBIN (HGB A1C): HEMOGLOBIN A1C: 5.4

## 2016-06-05 NOTE — Progress Notes (Addendum)
Subjective:    CC: HTN  HPI: Hypertension- Pt denies chest pain, SOB, dizziness, or heart palpitations.  Taking meds as directed w/o problems.  Denies medication side effects.    Hypothyroid - taking medication regular. No recent skin or hair changes. No major weight changes.    Impaired fasting glucose-no increased thirst or urination. No symptoms consistent with hypoglycemia.  She's also been having a lot of right knee pain. She's previously seen Dr. Dianah Field for knee osteoarthritis. In fact she was last seen about 2 months ago.  He also wanted me to check her ears today. She's been getting a little pressure and discomfort in them.  Past medical history, Surgical history, Family history not pertinant except as noted below, Social history, Allergies, and medications have been entered into the medical record, reviewed, and corrections made.   Review of Systems: No fevers, chills, night sweats, weight loss, chest pain, or shortness of breath.   Objective:    General: Well Developed, well nourished, and in no acute distress.  Neuro: Alert and oriented x3, extra-ocular muscles intact, sensation grossly intact.  HEENT: Normocephalic, atraumatic, tympanic membranes are clear. She has a little bit of wax buildup.  Skin: Warm and dry, no rashes. Cardiac: Regular rate and rhythm, no murmurs rubs or gallops, no lower extremity edema.  Respiratory: Clear to auscultation bilaterally. Not using accessory muscles, speaking in full sentences.   Impression and Recommendations:    HTN - Well controlled. Continue current regimen. Follow up in 6 months.    Hypothyroid - Doing well. She denies any problems. Continue current regimen. Recheck at f/u in 6 months.   IFG - Well controlled. Hg A1c of 5.4 today which looks absolutely fantastic. Continue current regimen. Follow-up in 6 months. Lab Results  Component Value Date   HGBA1C 5.4 06/05/2016    Right knee pain-I did encourage her to get  back in with Dr. Dianah Field.  Exam reassuring. Did recommend some over-the-counter earwax softening drops to use regularly to keep wax to a minimum.

## 2016-06-06 ENCOUNTER — Telehealth: Payer: Self-pay | Admitting: Family Medicine

## 2016-06-06 NOTE — Telephone Encounter (Signed)
Changed Rx directions for Lasix to 1 tablet twice weekly per Pt and PCP request.   Also removed Tramadol Rx per Pt request, states it "is not helping."

## 2016-06-10 DIAGNOSIS — E039 Hypothyroidism, unspecified: Secondary | ICD-10-CM | POA: Diagnosis not present

## 2016-06-11 LAB — TSH: TSH: 0.25 mIU/L — ABNORMAL LOW

## 2016-06-11 NOTE — Addendum Note (Signed)
Addended by: Teddy Spike on: 06/11/2016 02:25 PM   Modules accepted: Orders

## 2016-06-17 ENCOUNTER — Encounter: Payer: Self-pay | Admitting: Sports Medicine

## 2016-06-17 ENCOUNTER — Other Ambulatory Visit: Payer: Self-pay

## 2016-06-17 ENCOUNTER — Ambulatory Visit (INDEPENDENT_AMBULATORY_CARE_PROVIDER_SITE_OTHER): Payer: Medicare Other | Admitting: Sports Medicine

## 2016-06-17 ENCOUNTER — Encounter: Payer: Self-pay | Admitting: Family Medicine

## 2016-06-17 DIAGNOSIS — M170 Bilateral primary osteoarthritis of knee: Secondary | ICD-10-CM

## 2016-06-17 MED ORDER — ACETAMINOPHEN ER 650 MG PO TBCR: 1300.0000 mg | EXTENDED_RELEASE_TABLET | Freq: Three times a day (TID) | ORAL | 3 refills | Status: DC | PRN

## 2016-06-17 MED ORDER — DICLOFENAC SODIUM 2 % TD SOLN: 2.0000 | Freq: Two times a day (BID) | TRANSDERMAL | 11 refills | Status: DC

## 2016-06-17 NOTE — Progress Notes (Signed)
  Subjective:    CC: Follow-up  HPI: Right knee pain: Known bilateral knee osteoarthritis, has had Orthovisc in both knees in the past, also working hard with home health physical therapy and seems to be doing better. She is having a recurrence of pain lately in her right knee, with swelling and localized at the joint lines. Moderate, persistent, no radiation, no mechanical symptoms.  Past medical history:  Negative.  See flowsheet/record as well for more information.  Surgical history: Negative.  See flowsheet/record as well for more information.  Family history: Negative.  See flowsheet/record as well for more information.  Social history: Negative.  See flowsheet/record as well for more information.  Allergies, and medications have been entered into the medical record, reviewed, and no changes needed.   Review of Systems: No fevers, chills, night sweats, weight loss, chest pain, or shortness of breath.   Objective:    General: Well Developed, well nourished, and in no acute distress.  Neuro: Alert and oriented x3, extra-ocular muscles intact, sensation grossly intact.  HEENT: Normocephalic, atraumatic, pupils equal round reactive to light, neck supple, no masses, no lymphadenopathy, thyroid nonpalpable.  Skin: Warm and dry, no rashes. Cardiac: Regular rate and rhythm, no murmurs rubs or gallops, no lower extremity edema.  Respiratory: Clear to auscultation bilaterally. Not using accessory muscles, speaking in full sentences. Right Knee: Visibly swollen with tenderness over the joint lines ROM normal in flexion and extension and lower leg rotation. Ligaments with solid consistent endpoints including ACL, PCL, LCL, MCL. Negative Mcmurray's and provocative meniscal tests. Non painful patellar compression. Patellar and quadriceps tendons unremarkable. Hamstring and quadriceps strength is normal.  Procedure: Real-time Ultrasound Guided aspiration/injection of right knee Device: GE  Logiq E  Verbal informed consent obtained.  Time-out conducted.  Noted no overlying erythema, induration, or other signs of local infection.  Skin prepped in a sterile fashion.  Local anesthesia: Topical Ethyl chloride.  With sterile technique and under real time ultrasound guidance:  Using an 18-gauge needle aspirated 5 mL straw-colored fluid, syringe switched and 1 mL Kellogg 40, 2 mL lidocaine, 2 mL bupivacaine injected easily. Completed without difficulty  Pain immediately resolved suggesting accurate placement of the medication.  Advised to call if fevers/chills, erythema, induration, drainage, or persistent bleeding.  Images permanently stored and available for review in the ultrasound unit.  Impression: Technically successful ultrasound guided injection.  Impression and Recommendations:    Primary osteoarthritis of both knees Right knee aspiration and injection, we have finished Visco supplementation in both knees. Arthritis strength Tylenol should be taken 2 pills 3 times per day and I'm adding topical Pennsaid. Does seem to be getting some improvement with physical therapy.

## 2016-06-17 NOTE — Assessment & Plan Note (Signed)
Right knee aspiration and injection, we have finished Visco supplementation in both knees. Arthritis strength Tylenol should be taken 2 pills 3 times per day and I'm adding topical Pennsaid. Does seem to be getting some improvement with physical therapy.

## 2016-07-12 ENCOUNTER — Other Ambulatory Visit: Payer: Self-pay | Admitting: Family Medicine

## 2016-07-15 ENCOUNTER — Ambulatory Visit (INDEPENDENT_AMBULATORY_CARE_PROVIDER_SITE_OTHER): Payer: Medicare Other | Admitting: Sports Medicine

## 2016-07-15 ENCOUNTER — Encounter: Payer: Self-pay | Admitting: Sports Medicine

## 2016-07-15 DIAGNOSIS — M170 Bilateral primary osteoarthritis of knee: Secondary | ICD-10-CM

## 2016-07-15 MED ORDER — ACETAMINOPHEN ER 650 MG PO TBCR: 1300.0000 mg | EXTENDED_RELEASE_TABLET | Freq: Three times a day (TID) | ORAL | 3 refills | Status: AC

## 2016-07-15 MED ORDER — FUROSEMIDE 20 MG PO TABS: 20.0000 mg | ORAL_TABLET | ORAL | Status: DC

## 2016-07-15 NOTE — Progress Notes (Signed)
  Subjective:    CC: follow-up  HPI: Right knee osteoarthritis: This is a pleasant 83 year old female,at this point we have tried everything nonoperative including physical therapy, Tylenol, she cannot use NSAIDs due to her blood thinner, steroid injections, viscous supplementation, nothing has worked. She is not agreeable to proceed with arthroplasty.  Past medical history:  Negative.  See flowsheet/record as well for more information.  Surgical history: Negative.  See flowsheet/record as well for more information.  Family history: Negative.  See flowsheet/record as well for more information.  Social history: Negative.  See flowsheet/record as well for more information.  Allergies, and medications have been entered into the medical record, reviewed, and no changes needed.   Review of Systems: No fevers, chills, night sweats, weight loss, chest pain, or shortness of breath.   Objective:    General: Well Developed, well nourished, and in no acute distress.  Neuro: Alert and oriented x3, extra-ocular muscles intact, sensation grossly intact.  HEENT: Normocephalic, atraumatic, pupils equal round reactive to light, neck supple, no masses, no lymphadenopathy, thyroid nonpalpable.  Skin: Warm and dry, no rashes. Cardiac: Regular rate and rhythm, no murmurs rubs or gallops, no lower extremity edema.  Respiratory: Clear to auscultation bilaterally. Not using accessory muscles, speaking in full sentences. Right Knee: Minimally swollen with tenderness at the medial and lateral joint lines ROM normal in flexion and extension and lower leg rotation. Ligaments with solid consistent endpoints including ACL, PCL, LCL, MCL. Negative Mcmurray's and provocative meniscal tests. Non painful patellar compression. Patellar and quadriceps tendons unremarkable. Hamstring and quadriceps strength is normal.  Knee was strapped with a compressive brace  Impression and Recommendations:    Primary  osteoarthritis of both knees At this point has failed arthritis from Tylenol, topical NSAIDs, physical therapy, steroid injections, viscous supplementation. Knee arthroplasty is the next step however patient declines this. We have exhausted everything nonoperative. We are going to try a knee brace. She can return as needed, she may be moving out of state.

## 2016-07-15 NOTE — Assessment & Plan Note (Signed)
At this point has failed arthritis from Tylenol, topical NSAIDs, physical therapy, steroid injections, viscous supplementation. Knee arthroplasty is the next step however patient declines this. We have exhausted everything nonoperative. We are going to try a knee brace. She can return as needed, she may be moving out of state.

## 2016-07-18 ENCOUNTER — Telehealth: Payer: Self-pay | Admitting: Cardiology

## 2016-07-18 DIAGNOSIS — I4891 Unspecified atrial fibrillation: Secondary | ICD-10-CM

## 2016-07-18 MED ORDER — APIXABAN 5 MG PO TABS: 5.0000 mg | ORAL_TABLET | Freq: Two times a day (BID) | ORAL | 2 refills | Status: DC

## 2016-07-18 NOTE — Telephone Encounter (Signed)
Refill sent to the pharmacy electronically.  

## 2016-07-18 NOTE — Telephone Encounter (Signed)
New message     *STAT* If patient is at the pharmacy, call can be transferred to refill team.   1. Which medications need to be refilled? (please list name of each medication and dose if known) Eliquis 2.5 mg   2. Which pharmacy/location (including street and city if local pharmacy) is medication to be sent to? [facility] in [place]  3. Do they need a 30 day or 90 day supply? 30 day

## 2016-07-19 ENCOUNTER — Telehealth: Payer: Self-pay | Admitting: Physician Assistant

## 2016-07-19 ENCOUNTER — Other Ambulatory Visit: Payer: Self-pay | Admitting: Physician Assistant

## 2016-07-19 ENCOUNTER — Encounter: Payer: Self-pay | Admitting: Family Medicine

## 2016-07-19 DIAGNOSIS — I4891 Unspecified atrial fibrillation: Secondary | ICD-10-CM

## 2016-07-19 MED ORDER — APIXABAN 5 MG PO TABS: 5.0000 mg | ORAL_TABLET | Freq: Two times a day (BID) | ORAL | 0 refills | Status: DC

## 2016-07-19 NOTE — Progress Notes (Signed)
    Patient's daughter inlaw called to get Rx of Eliquis refilled. I have sent a 1 month supply to 5mg  ( correct dosing as she is 83 yo, 68kg and creat 1.1.) She will need to be seen in the office as it has been over 1 year. I will send a note to Dr. Jacalyn Lefevre nurse to get an appointment arranged with Dr. Stanford Breed or an APP on his team.  Angelena Form PA-C  [facility]

## 2016-07-19 NOTE — Telephone Encounter (Signed)
    Patient's daughter inlaw called to get Rx of Eliquis refilled. I have sent a 1 month supply to 5[facility]  ( correct dosing as she is 83 yo, 68kg and creat 1.1.) She will need to be seen in the office as it has been over 1 year. I will send a note to Dr. Jacalyn Lefevre nurse to get an appointment arranged with Dr. Stanford Breed or an APP on his team.  Angelena Form PA-C  [facility]

## 2016-07-21 ENCOUNTER — Telehealth: Payer: Self-pay | Admitting: Cardiology

## 2016-07-21 DIAGNOSIS — I4891 Unspecified atrial fibrillation: Secondary | ICD-10-CM

## 2016-07-21 MED ORDER — APIXABAN 5 MG PO TABS: 5.0000 mg | ORAL_TABLET | Freq: Two times a day (BID) | ORAL | 1 refills | Status: DC

## 2016-07-21 NOTE — Telephone Encounter (Signed)
°*  STAT* If patient is at the pharmacy, call can be transferred to refill team.   1. Which medications need to be refilled? (please list name of each medication and dose if known) Eliquis, 5 mg  2. Which pharmacy/location (including street and city if local pharmacy) is medication to be sent to? [facility] [place], [place] [address] AT [address]  3. Do they need a 30 day or 90 day supply? 30   Patient is scheduled to see Dr. Stanford Breed 09-24-16 @4 :63 in [place].

## 2016-08-10 NOTE — Progress Notes (Signed)
Cardiology Office Note    Date:  08/11/2016   ID:  Tracie Nguyen, DOB 15-May-1925, MRN [id number]  PCP:  Hali Marry, MD  Cardiologist: Dr. Stanford Breed   Chief Complaint  Patient presents with  . Follow-up    Annual Visit    History of Present Illness:    Tracie Nguyen is a 83 y.o. female with past medical history of permanent atrial fibrillation (on Eliquis), HTN, and Osteoporosis who presents to the office for 1-year follow-up.   She was last examined by Dr. Stanford Breed in 06/2015 and was doing well from a cardiac perspective at that time. She reported mild dyspnea on exertion but denied any recent chest pain or palpitations. Was continued on Eliquis 5mg  BID for anticoagulation.    In talking with the patient today, she reports overall doing well since her previous office visit. She is planning to move to [place] and reside in an [place] later this month, as her daughter lives there. She reports baseline dyspnea on exertion which has occurred for many years and denies any acute worsening of her symptoms. She denies any chest discomfort, lightheadedness, dizziness, presyncope, orthopnea, PND, or lower extremity edema. She does note occasional palpitations which occur sporadically.  She reports good compliance with her Eliquis and denies any evidence of active bleeding. She did experience a mechanical fall last month which occurred when she tripped over the cat. She did not hit her head at that time, as she landed on her buttocks. Did not go to the ED for evaluation.   Past Medical History:  Diagnosis Date  . Atrial fibrillation ([place])    a. on Eliquis for anticoagulation.   . Hypertension   . Hypothyroid   . Osteoporosis 12/20/2015   T score -3.2 done November 2017    Past Surgical History:  Procedure Laterality Date  . CATARACT EXTRACTION BILATERAL W/ ANTERIOR VITRECTOMY Right   . TONSILLECTOMY     as a child  . TOTAL HIP ARTHROPLASTY      Current  Medications: Outpatient Medications Prior to Visit  Medication Sig Dispense Refill  . acetaminophen (TYLENOL) 650 MG CR tablet Take 2 tablets (1,300 mg total) by mouth 3 (three) times daily. 240 tablet 3  . calcium citrate-vitamin D (CITRACAL+D) 315-200 MG-UNIT tablet Take 1 tablet by mouth 2 (two) times daily.    . diphenoxylate-atropine (LOMOTIL) 2.5-0.025 MG tablet Take by mouth 4 (four) times daily as needed for diarrhea or loose stools.    . gabapentin (NEURONTIN) 600 MG tablet TAKE 1/2 TABLET BY MOUTH EVERY MORNING, 1 TABLET AT NOON, AND 1 EVERY NIGHT AT BEDTIME 225 tablet 0  . levothyroxine (SYNTHROID, LEVOTHROID) 137 MCG tablet Take 1 tablet (137 mcg total) by mouth daily before breakfast. APPOINTMENT NEEDED FOR FURTHER REFILLS 90 tablet 0  . Multiple Vitamin (MULTIVITAMIN) tablet Take 2 tablets by mouth daily.     . Omega-3 Fatty Acids (FISH OIL) 1200 MG CAPS Take 2,400 mg by mouth.    Marland Kitchen apixaban (ELIQUIS) 5 MG TABS tablet Take 1 tablet (5 mg total) by mouth 2 (two) times daily. 60 tablet 1  . CARTIA XT 240 MG 24 hr capsule     . furosemide (LASIX) 20 MG tablet Take 1 tablet (20 mg total) by mouth 2 (two) times a week.    . losartan (COZAAR) 100 MG tablet Take 1 tablet (100 mg total) by mouth daily. APPOINTMENT NEEDED FOR FURTHER REFILLS 90 tablet 0  . metoprolol (LOPRESSOR) 100 MG  tablet Take 50 mg by mouth daily.     . Diclofenac Sodium 2 % SOLN Place 2 sprays onto the skin 2 (two) times daily. 1 Bottle 11   No facility-administered medications prior to visit.      Allergies:   Patient has no known allergies.   Social History   Social History  . Marital status: Widowed    Spouse name: N/A  . Number of children: 2  . Years of education: N/A   Social History Main Topics  . Smoking status: Never Smoker  . Smokeless tobacco: Never Used  . Alcohol use No  . Drug use: No  . Sexual activity: Not Currently   Other Topics Concern  . None   Social History Narrative   Moved  here form [place], [place] to be close to family      Family History:  The patient's family history includes Heart attack in her father; Stroke in her mother.   Review of Systems:   Please see the history of present illness.     General:  No chills, fever, night sweats or weight changes.  Cardiovascular:  No chest pain, edema, orthopnea, paroxysmal nocturnal dyspnea. Positive for palpitations and dyspnea on exertion.  Dermatological: No rash, lesions/masses Respiratory: No cough, dyspnea Urologic: No hematuria, dysuria Abdominal:   No nausea, vomiting, diarrhea, bright red blood per rectum, melena, or hematemesis Neurologic:  No visual changes, wkns, changes in mental status. All other systems reviewed and are otherwise negative except as noted above.   Physical Exam:    VS:  BP 128/87   Pulse 94   Ht 5\' 6"  (1.676 m)   Wt 141 lb (64 kg)   BMI 22.76 kg/m    General: Well developed, elderly Caucasian female appearing in no acute distress. Head: Normocephalic, atraumatic, sclera non-icteric, no xanthomas, nares are without discharge.  Neck: No carotid bruits. JVD not elevated.  Lungs: Respirations regular and unlabored, without wheezes or rales.  Heart: Irregularly irregular. No S3 or S4.  No murmur, no rubs, or gallops appreciated. Abdomen: Soft, non-tender, non-distended with normoactive bowel sounds. No hepatomegaly. No rebound/guarding. No obvious abdominal masses. Msk:  Strength and tone appear normal for age. No joint deformities or effusions. Extremities: No clubbing or cyanosis. No lower extremity edema.  Distal pedal pulses are 2+ bilaterally. Neuro: Alert and oriented X 3. Moves all extremities spontaneously. No focal deficits noted. Psych:  Responds to questions appropriately with a normal affect. Skin: No rashes or lesions noted  Wt Readings from Last 3 Encounters:  08/11/16 141 lb (64 kg)  07/15/16 150 lb (68 kg)  06/17/16 147 lb 9.6 oz (67 kg)     Studies/Labs  Reviewed:   EKG:  EKG is ordered today.  The ekg ordered today demonstrates atrial fibrillation, HR 94. No acute ST or T-wave changes when compared to prior tracings.   Recent Labs: 12/05/2015: Hemoglobin 11.5; Platelets 256 02/06/2016: ALT 15 05/29/2016: BUN 18; Creat 1.11; Potassium 4.6; Sodium 142 06/10/2016: TSH 0.25   Lipid Panel    Component Value Date/Time   CHOL 162 12/05/2015 0902   TRIG 105 12/05/2015 0902   HDL 52 12/05/2015 0902   CHOLHDL 3.1 12/05/2015 0902   VLDL 21 12/05/2015 0902   LDLCALC 89 12/05/2015 0902    Additional studies/ records that were reviewed today include:   EKG: 01/2015: Atrial fibrillation, HR 75.  Assessment:    1. Permanent atrial fibrillation ([place])   2. Chronic anticoagulation   3. Essential  hypertension   4. Accident due to mechanical fall without injury, sequela      Plan:   In order of problems listed above:  1. Permanent Atrial Fibrillation/ Chronic Anticoagulation - the patient reports baseline dyspnea on exertion which has occurred for many years and denies any acute worsening of her symptoms. No associated chest discomfort, lightheadedness, dizziness, presyncope, orthopnea, PND, or lower extremity edema. She does note occasional palpitations which occur sporadically. - continue Toprol-XL 50mg  daily and Diltiazem CD 240mg  daily for rate-control. - she denies any evidence of active bleeding. Has experienced two mechanical falls within the past year but she is transferring to an ALF where she will be monitored closely. Continue Eliquis 5mg  BID for anticoagulation.   2. HTN - BP is well-controlled at 128/87 during today's visit. - continue Diltiazem CD 240mg  daily, Toprol-XL 50mg  daily, and Losartan 100mg  daily.   3. Mechanical Falls - she reports having 2 mechanical falls within the past 6+ months, one where she tripped on a curb and the other occurring as she tripped over the cat. - she did not hit her head with either episode.    - she is planning to transfer to an ALF later this month and will be monitored closely for frequent falls.     Medication Adjustments/Labs and Tests Ordered: Current medicines are reviewed at length with the patient today.  Concerns regarding medicines are outlined above.  Medication changes, Labs and Tests ordered today are listed in the Patient Instructions below. Patient Instructions  Medication Instructions:  Your physician recommends that you continue on your current medications as directed. Please refer to the Current Medication list given to you today.  Follow-Up: Good luck with your upcoming move, once you know the practice that will be following you, fax medical release form to [phone].  Any Other Special Instructions Will Be Listed Below (If Applicable).  If you need a refill on your cardiac medications before your next appointment, please call your pharmacy.   Signed, Erma Heritage, PA-C  08/11/2016 7:30 PM    [place], [place], [place]  [address] Phone: [phone]; Fax: [phone]  [address], [place], [place] [address] Phone: [phone]

## 2016-08-11 ENCOUNTER — Ambulatory Visit (INDEPENDENT_AMBULATORY_CARE_PROVIDER_SITE_OTHER): Payer: Medicare Other | Admitting: Student

## 2016-08-11 ENCOUNTER — Encounter: Payer: Self-pay | Admitting: Student

## 2016-08-11 VITALS — BP 128/87 | HR 94 | Ht 66.0 in | Wt 141.0 lb

## 2016-08-11 DIAGNOSIS — I482 Chronic atrial fibrillation: Secondary | ICD-10-CM | POA: Diagnosis not present

## 2016-08-11 DIAGNOSIS — Z7901 Long term (current) use of anticoagulants: Secondary | ICD-10-CM | POA: Diagnosis not present

## 2016-08-11 DIAGNOSIS — W19XXXS Unspecified fall, sequela: Secondary | ICD-10-CM

## 2016-08-11 DIAGNOSIS — I10 Essential (primary) hypertension: Secondary | ICD-10-CM

## 2016-08-11 DIAGNOSIS — I4821 Permanent atrial fibrillation: Secondary | ICD-10-CM

## 2016-08-11 MED ORDER — APIXABAN 5 MG PO TABS: 5.0000 mg | ORAL_TABLET | Freq: Two times a day (BID) | ORAL | 3 refills | Status: AC

## 2016-08-11 MED ORDER — FUROSEMIDE 20 MG PO TABS: 20.0000 mg | ORAL_TABLET | ORAL | 3 refills | Status: DC

## 2016-08-11 MED ORDER — METOPROLOL SUCCINATE ER 50 MG PO TB24
50.0000 mg | ORAL_TABLET | Freq: Every day | ORAL | 3 refills | Status: AC
Start: 2016-08-11 — End: 2016-11-09

## 2016-08-11 MED ORDER — LOSARTAN POTASSIUM 100 MG PO TABS: 100.0000 mg | ORAL_TABLET | Freq: Every day | ORAL | 3 refills | Status: AC

## 2016-08-11 MED ORDER — CARTIA XT 240 MG PO CP24: 240.0000 mg | ORAL_CAPSULE | Freq: Every day | ORAL | 3 refills | Status: AC

## 2016-08-11 NOTE — Patient Instructions (Signed)
Medication Instructions:  Your physician recommends that you continue on your current medications as directed. Please refer to the Current Medication list given to you today.  Follow-Up: Good luck with your upcoming move, once you know the practice that will be following you, fax medical release form to [phone].  Any Other Special Instructions Will Be Listed Below (If Applicable).     If you need a refill on your cardiac medications before your next appointment, please call your pharmacy.

## 2016-08-18 DIAGNOSIS — E039 Hypothyroidism, unspecified: Secondary | ICD-10-CM | POA: Diagnosis not present

## 2016-08-19 ENCOUNTER — Encounter: Payer: Self-pay | Admitting: Family Medicine

## 2016-08-19 LAB — TSH: TSH: 0.55 mIU/L

## 2016-08-23 ENCOUNTER — Other Ambulatory Visit: Payer: Self-pay | Admitting: Family Medicine

## 2016-08-28 ENCOUNTER — Ambulatory Visit: Payer: Self-pay | Admitting: Family Medicine

## 2016-09-20 DIAGNOSIS — Z96641 Presence of right artificial hip joint: Secondary | ICD-10-CM | POA: Diagnosis not present

## 2016-09-20 DIAGNOSIS — S42332A Displaced oblique fracture of shaft of humerus, left arm, initial encounter for closed fracture: Secondary | ICD-10-CM | POA: Diagnosis not present

## 2016-09-20 DIAGNOSIS — S2232XA Fracture of one rib, left side, initial encounter for closed fracture: Secondary | ICD-10-CM | POA: Diagnosis not present

## 2016-09-20 DIAGNOSIS — S42392A Other fracture of shaft of left humerus, initial encounter for closed fracture: Secondary | ICD-10-CM | POA: Diagnosis not present

## 2016-09-20 DIAGNOSIS — M25552 Pain in left hip: Secondary | ICD-10-CM | POA: Diagnosis not present

## 2016-09-20 DIAGNOSIS — M7989 Other specified soft tissue disorders: Secondary | ICD-10-CM | POA: Diagnosis not present

## 2016-09-20 DIAGNOSIS — R930 Abnormal findings on diagnostic imaging of skull and head, not elsewhere classified: Secondary | ICD-10-CM | POA: Diagnosis not present

## 2016-09-20 DIAGNOSIS — D62 Acute posthemorrhagic anemia: Secondary | ICD-10-CM | POA: Diagnosis not present

## 2016-09-20 DIAGNOSIS — I10 Essential (primary) hypertension: Secondary | ICD-10-CM | POA: Diagnosis not present

## 2016-09-20 DIAGNOSIS — M1612 Unilateral primary osteoarthritis, left hip: Secondary | ICD-10-CM | POA: Diagnosis not present

## 2016-09-20 DIAGNOSIS — T148XXA Other injury of unspecified body region, initial encounter: Secondary | ICD-10-CM | POA: Diagnosis not present

## 2016-09-20 DIAGNOSIS — S0990XA Unspecified injury of head, initial encounter: Secondary | ICD-10-CM | POA: Diagnosis not present

## 2016-09-20 DIAGNOSIS — N179 Acute kidney failure, unspecified: Secondary | ICD-10-CM | POA: Diagnosis not present

## 2016-09-20 DIAGNOSIS — M79602 Pain in left arm: Secondary | ICD-10-CM | POA: Diagnosis not present

## 2016-09-20 DIAGNOSIS — M25551 Pain in right hip: Secondary | ICD-10-CM | POA: Diagnosis not present

## 2016-09-20 DIAGNOSIS — Z66 Do not resuscitate: Secondary | ICD-10-CM | POA: Diagnosis not present

## 2016-09-20 DIAGNOSIS — R110 Nausea: Secondary | ICD-10-CM | POA: Diagnosis not present

## 2016-09-20 DIAGNOSIS — W010XXA Fall on same level from slipping, tripping and stumbling without subsequent striking against object, initial encounter: Secondary | ICD-10-CM | POA: Diagnosis not present

## 2016-09-20 DIAGNOSIS — G319 Degenerative disease of nervous system, unspecified: Secondary | ICD-10-CM | POA: Diagnosis not present

## 2016-09-20 DIAGNOSIS — R51 Headache: Secondary | ICD-10-CM | POA: Diagnosis not present

## 2016-09-20 DIAGNOSIS — M25431 Effusion, right wrist: Secondary | ICD-10-CM | POA: Diagnosis not present

## 2016-09-20 DIAGNOSIS — M79632 Pain in left forearm: Secondary | ICD-10-CM | POA: Diagnosis not present

## 2016-09-20 DIAGNOSIS — S79921A Unspecified injury of right thigh, initial encounter: Secondary | ICD-10-CM | POA: Diagnosis not present

## 2016-09-20 DIAGNOSIS — S52022A Displaced fracture of olecranon process without intraarticular extension of left ulna, initial encounter for closed fracture: Secondary | ICD-10-CM | POA: Diagnosis not present

## 2016-09-20 DIAGNOSIS — S42352A Displaced comminuted fracture of shaft of humerus, left arm, initial encounter for closed fracture: Secondary | ICD-10-CM | POA: Diagnosis not present

## 2016-09-20 DIAGNOSIS — M25512 Pain in left shoulder: Secondary | ICD-10-CM | POA: Diagnosis not present

## 2016-09-21 DIAGNOSIS — E039 Hypothyroidism, unspecified: Secondary | ICD-10-CM | POA: Diagnosis not present

## 2016-09-21 DIAGNOSIS — W19XXXS Unspecified fall, sequela: Secondary | ICD-10-CM | POA: Diagnosis not present

## 2016-09-21 DIAGNOSIS — I4891 Unspecified atrial fibrillation: Secondary | ICD-10-CM | POA: Diagnosis not present

## 2016-09-21 DIAGNOSIS — S2232XS Fracture of one rib, left side, sequela: Secondary | ICD-10-CM | POA: Diagnosis not present

## 2016-09-21 DIAGNOSIS — M810 Age-related osteoporosis without current pathological fracture: Secondary | ICD-10-CM | POA: Diagnosis not present

## 2016-09-21 DIAGNOSIS — I10 Essential (primary) hypertension: Secondary | ICD-10-CM | POA: Diagnosis not present

## 2016-09-21 DIAGNOSIS — S42392A Other fracture of shaft of left humerus, initial encounter for closed fracture: Secondary | ICD-10-CM | POA: Diagnosis not present

## 2016-09-21 DIAGNOSIS — Z7901 Long term (current) use of anticoagulants: Secondary | ICD-10-CM | POA: Diagnosis not present

## 2016-09-22 ENCOUNTER — Encounter: Payer: Self-pay | Admitting: Family Medicine

## 2016-09-22 DIAGNOSIS — I959 Hypotension, unspecified: Secondary | ICD-10-CM | POA: Diagnosis present

## 2016-09-22 DIAGNOSIS — Z9181 History of falling: Secondary | ICD-10-CM | POA: Diagnosis not present

## 2016-09-22 DIAGNOSIS — E639 Nutritional deficiency, unspecified: Secondary | ICD-10-CM | POA: Diagnosis present

## 2016-09-22 DIAGNOSIS — S42202A Unspecified fracture of upper end of left humerus, initial encounter for closed fracture: Secondary | ICD-10-CM | POA: Diagnosis not present

## 2016-09-22 DIAGNOSIS — Z66 Do not resuscitate: Secondary | ICD-10-CM | POA: Diagnosis present

## 2016-09-22 DIAGNOSIS — N179 Acute kidney failure, unspecified: Secondary | ICD-10-CM | POA: Diagnosis present

## 2016-09-22 DIAGNOSIS — D62 Acute posthemorrhagic anemia: Secondary | ICD-10-CM | POA: Diagnosis not present

## 2016-09-22 DIAGNOSIS — M25822 Other specified joint disorders, left elbow: Secondary | ICD-10-CM | POA: Diagnosis not present

## 2016-09-22 DIAGNOSIS — E039 Hypothyroidism, unspecified: Secondary | ICD-10-CM | POA: Diagnosis present

## 2016-09-22 DIAGNOSIS — S52022D Displaced fracture of olecranon process without intraarticular extension of left ulna, subsequent encounter for closed fracture with routine healing: Secondary | ICD-10-CM | POA: Diagnosis not present

## 2016-09-22 DIAGNOSIS — S2232XS Fracture of one rib, left side, sequela: Secondary | ICD-10-CM | POA: Diagnosis not present

## 2016-09-22 DIAGNOSIS — W19XXXS Unspecified fall, sequela: Secondary | ICD-10-CM | POA: Diagnosis not present

## 2016-09-22 DIAGNOSIS — S42302D Unspecified fracture of shaft of humerus, left arm, subsequent encounter for fracture with routine healing: Secondary | ICD-10-CM | POA: Diagnosis not present

## 2016-09-22 DIAGNOSIS — I10 Essential (primary) hypertension: Secondary | ICD-10-CM | POA: Diagnosis present

## 2016-09-22 DIAGNOSIS — S52022A Displaced fracture of olecranon process without intraarticular extension of left ulna, initial encounter for closed fracture: Secondary | ICD-10-CM | POA: Diagnosis present

## 2016-09-22 DIAGNOSIS — Z79899 Other long term (current) drug therapy: Secondary | ICD-10-CM | POA: Diagnosis not present

## 2016-09-22 DIAGNOSIS — I4891 Unspecified atrial fibrillation: Secondary | ICD-10-CM | POA: Diagnosis not present

## 2016-09-22 DIAGNOSIS — I482 Chronic atrial fibrillation: Secondary | ICD-10-CM | POA: Diagnosis present

## 2016-09-22 DIAGNOSIS — M810 Age-related osteoporosis without current pathological fracture: Secondary | ICD-10-CM | POA: Diagnosis present

## 2016-09-22 DIAGNOSIS — S2232XD Fracture of one rib, left side, subsequent encounter for fracture with routine healing: Secondary | ICD-10-CM | POA: Diagnosis not present

## 2016-09-22 DIAGNOSIS — K5900 Constipation, unspecified: Secondary | ICD-10-CM | POA: Diagnosis present

## 2016-09-22 DIAGNOSIS — S2232XA Fracture of one rib, left side, initial encounter for closed fracture: Secondary | ICD-10-CM | POA: Diagnosis present

## 2016-09-22 DIAGNOSIS — Z7901 Long term (current) use of anticoagulants: Secondary | ICD-10-CM | POA: Diagnosis not present

## 2016-09-22 DIAGNOSIS — Z7409 Other reduced mobility: Secondary | ICD-10-CM | POA: Diagnosis not present

## 2016-09-22 DIAGNOSIS — S42332A Displaced oblique fracture of shaft of humerus, left arm, initial encounter for closed fracture: Secondary | ICD-10-CM | POA: Diagnosis present

## 2016-09-22 DIAGNOSIS — S42392A Other fracture of shaft of left humerus, initial encounter for closed fracture: Secondary | ICD-10-CM | POA: Diagnosis not present

## 2016-09-22 DIAGNOSIS — I480 Paroxysmal atrial fibrillation: Secondary | ICD-10-CM | POA: Diagnosis not present

## 2016-09-24 ENCOUNTER — Ambulatory Visit: Payer: Medicare Other | Admitting: Cardiology

## 2016-09-27 DIAGNOSIS — S42302A Unspecified fracture of shaft of humerus, left arm, initial encounter for closed fracture: Secondary | ICD-10-CM | POA: Diagnosis not present

## 2016-09-27 DIAGNOSIS — R52 Pain, unspecified: Secondary | ICD-10-CM | POA: Diagnosis not present

## 2016-09-27 DIAGNOSIS — I5189 Other ill-defined heart diseases: Secondary | ICD-10-CM | POA: Diagnosis not present

## 2016-09-27 DIAGNOSIS — E038 Other specified hypothyroidism: Secondary | ICD-10-CM | POA: Diagnosis not present

## 2016-09-27 DIAGNOSIS — I482 Chronic atrial fibrillation: Secondary | ICD-10-CM | POA: Diagnosis not present

## 2016-09-27 DIAGNOSIS — M810 Age-related osteoporosis without current pathological fracture: Secondary | ICD-10-CM | POA: Diagnosis not present

## 2016-09-27 DIAGNOSIS — Z7901 Long term (current) use of anticoagulants: Secondary | ICD-10-CM | POA: Diagnosis not present

## 2016-09-27 DIAGNOSIS — Z9181 History of falling: Secondary | ICD-10-CM | POA: Diagnosis not present

## 2016-09-27 DIAGNOSIS — K5900 Constipation, unspecified: Secondary | ICD-10-CM | POA: Diagnosis not present

## 2016-09-27 DIAGNOSIS — S42302D Unspecified fracture of shaft of humerus, left arm, subsequent encounter for fracture with routine healing: Secondary | ICD-10-CM | POA: Diagnosis not present

## 2016-09-27 DIAGNOSIS — E039 Hypothyroidism, unspecified: Secondary | ICD-10-CM | POA: Diagnosis not present

## 2016-09-27 DIAGNOSIS — S2232XS Fracture of one rib, left side, sequela: Secondary | ICD-10-CM | POA: Diagnosis not present

## 2016-09-27 DIAGNOSIS — W19XXXS Unspecified fall, sequela: Secondary | ICD-10-CM | POA: Diagnosis not present

## 2016-09-27 DIAGNOSIS — I4891 Unspecified atrial fibrillation: Secondary | ICD-10-CM | POA: Diagnosis not present

## 2016-09-27 DIAGNOSIS — S52022D Displaced fracture of olecranon process without intraarticular extension of left ulna, subsequent encounter for closed fracture with routine healing: Secondary | ICD-10-CM | POA: Diagnosis not present

## 2016-09-27 DIAGNOSIS — Z7409 Other reduced mobility: Secondary | ICD-10-CM | POA: Diagnosis not present

## 2016-09-27 DIAGNOSIS — S2232XD Fracture of one rib, left side, subsequent encounter for fracture with routine healing: Secondary | ICD-10-CM | POA: Diagnosis not present

## 2016-09-27 DIAGNOSIS — I480 Paroxysmal atrial fibrillation: Secondary | ICD-10-CM | POA: Diagnosis not present

## 2016-09-27 DIAGNOSIS — S42392A Other fracture of shaft of left humerus, initial encounter for closed fracture: Secondary | ICD-10-CM | POA: Diagnosis not present

## 2016-09-27 DIAGNOSIS — I10 Essential (primary) hypertension: Secondary | ICD-10-CM | POA: Diagnosis not present

## 2016-09-30 DIAGNOSIS — S42302A Unspecified fracture of shaft of humerus, left arm, initial encounter for closed fracture: Secondary | ICD-10-CM | POA: Diagnosis not present

## 2016-09-30 DIAGNOSIS — E038 Other specified hypothyroidism: Secondary | ICD-10-CM | POA: Diagnosis not present

## 2016-09-30 DIAGNOSIS — I482 Chronic atrial fibrillation: Secondary | ICD-10-CM | POA: Diagnosis not present

## 2016-09-30 DIAGNOSIS — I10 Essential (primary) hypertension: Secondary | ICD-10-CM | POA: Diagnosis not present

## 2016-10-03 DIAGNOSIS — I482 Chronic atrial fibrillation: Secondary | ICD-10-CM | POA: Diagnosis not present

## 2016-10-03 DIAGNOSIS — S42302D Unspecified fracture of shaft of humerus, left arm, subsequent encounter for fracture with routine healing: Secondary | ICD-10-CM | POA: Diagnosis not present

## 2016-10-03 DIAGNOSIS — E038 Other specified hypothyroidism: Secondary | ICD-10-CM | POA: Diagnosis not present

## 2016-10-03 DIAGNOSIS — M810 Age-related osteoporosis without current pathological fracture: Secondary | ICD-10-CM | POA: Diagnosis not present

## 2016-10-10 DIAGNOSIS — R52 Pain, unspecified: Secondary | ICD-10-CM | POA: Diagnosis not present

## 2016-10-14 DIAGNOSIS — M810 Age-related osteoporosis without current pathological fracture: Secondary | ICD-10-CM | POA: Diagnosis not present

## 2016-10-14 DIAGNOSIS — I5189 Other ill-defined heart diseases: Secondary | ICD-10-CM | POA: Diagnosis not present

## 2016-10-14 DIAGNOSIS — I10 Essential (primary) hypertension: Secondary | ICD-10-CM | POA: Diagnosis not present

## 2016-10-14 DIAGNOSIS — I482 Chronic atrial fibrillation: Secondary | ICD-10-CM | POA: Diagnosis not present

## 2016-11-07 DIAGNOSIS — Z8781 Personal history of (healed) traumatic fracture: Secondary | ICD-10-CM | POA: Diagnosis not present

## 2016-11-07 DIAGNOSIS — I482 Chronic atrial fibrillation: Secondary | ICD-10-CM | POA: Diagnosis not present

## 2016-11-07 DIAGNOSIS — Z967 Presence of other bone and tendon implants: Secondary | ICD-10-CM | POA: Diagnosis not present

## 2016-11-07 DIAGNOSIS — Z972 Presence of dental prosthetic device (complete) (partial): Secondary | ICD-10-CM | POA: Diagnosis not present

## 2016-11-07 DIAGNOSIS — R609 Edema, unspecified: Secondary | ICD-10-CM | POA: Diagnosis not present

## 2016-11-07 DIAGNOSIS — E039 Hypothyroidism, unspecified: Secondary | ICD-10-CM | POA: Diagnosis not present

## 2016-11-07 DIAGNOSIS — D62 Acute posthemorrhagic anemia: Secondary | ICD-10-CM | POA: Diagnosis not present

## 2016-11-07 DIAGNOSIS — R2681 Unsteadiness on feet: Secondary | ICD-10-CM | POA: Diagnosis not present

## 2016-11-07 DIAGNOSIS — G629 Polyneuropathy, unspecified: Secondary | ICD-10-CM | POA: Diagnosis not present

## 2016-11-07 DIAGNOSIS — I10 Essential (primary) hypertension: Secondary | ICD-10-CM | POA: Diagnosis not present

## 2016-11-09 ENCOUNTER — Other Ambulatory Visit: Payer: Self-pay | Admitting: Family Medicine

## 2016-11-12 DIAGNOSIS — S52022A Displaced fracture of olecranon process without intraarticular extension of left ulna, initial encounter for closed fracture: Secondary | ICD-10-CM | POA: Diagnosis not present

## 2016-11-12 DIAGNOSIS — Z967 Presence of other bone and tendon implants: Secondary | ICD-10-CM | POA: Diagnosis not present

## 2016-11-12 DIAGNOSIS — S42202A Unspecified fracture of upper end of left humerus, initial encounter for closed fracture: Secondary | ICD-10-CM | POA: Diagnosis not present

## 2016-11-13 DIAGNOSIS — I482 Chronic atrial fibrillation: Secondary | ICD-10-CM | POA: Diagnosis not present

## 2016-11-13 DIAGNOSIS — G629 Polyneuropathy, unspecified: Secondary | ICD-10-CM | POA: Diagnosis not present

## 2016-11-13 DIAGNOSIS — S42202D Unspecified fracture of upper end of left humerus, subsequent encounter for fracture with routine healing: Secondary | ICD-10-CM | POA: Diagnosis not present

## 2016-11-13 DIAGNOSIS — M8000XD Age-related osteoporosis with current pathological fracture, unspecified site, subsequent encounter for fracture with routine healing: Secondary | ICD-10-CM | POA: Diagnosis not present

## 2016-11-13 DIAGNOSIS — I5022 Chronic systolic (congestive) heart failure: Secondary | ICD-10-CM | POA: Diagnosis not present

## 2016-11-13 DIAGNOSIS — S52022D Displaced fracture of olecranon process without intraarticular extension of left ulna, subsequent encounter for closed fracture with routine healing: Secondary | ICD-10-CM | POA: Diagnosis not present

## 2016-11-13 DIAGNOSIS — R2681 Unsteadiness on feet: Secondary | ICD-10-CM | POA: Diagnosis not present

## 2016-11-13 DIAGNOSIS — Z4789 Encounter for other orthopedic aftercare: Secondary | ICD-10-CM | POA: Diagnosis not present

## 2016-11-13 DIAGNOSIS — R609 Edema, unspecified: Secondary | ICD-10-CM | POA: Diagnosis not present

## 2016-11-14 DIAGNOSIS — S42202D Unspecified fracture of upper end of left humerus, subsequent encounter for fracture with routine healing: Secondary | ICD-10-CM | POA: Diagnosis not present

## 2016-11-14 DIAGNOSIS — R2681 Unsteadiness on feet: Secondary | ICD-10-CM | POA: Diagnosis not present

## 2016-11-14 DIAGNOSIS — M8000XD Age-related osteoporosis with current pathological fracture, unspecified site, subsequent encounter for fracture with routine healing: Secondary | ICD-10-CM | POA: Diagnosis not present

## 2016-11-14 DIAGNOSIS — Z4789 Encounter for other orthopedic aftercare: Secondary | ICD-10-CM | POA: Diagnosis not present

## 2016-11-14 DIAGNOSIS — G629 Polyneuropathy, unspecified: Secondary | ICD-10-CM | POA: Diagnosis not present

## 2016-11-14 DIAGNOSIS — S52022D Displaced fracture of olecranon process without intraarticular extension of left ulna, subsequent encounter for closed fracture with routine healing: Secondary | ICD-10-CM | POA: Diagnosis not present

## 2016-11-17 DIAGNOSIS — G629 Polyneuropathy, unspecified: Secondary | ICD-10-CM | POA: Diagnosis not present

## 2016-11-17 DIAGNOSIS — S52022D Displaced fracture of olecranon process without intraarticular extension of left ulna, subsequent encounter for closed fracture with routine healing: Secondary | ICD-10-CM | POA: Diagnosis not present

## 2016-11-17 DIAGNOSIS — Z4789 Encounter for other orthopedic aftercare: Secondary | ICD-10-CM | POA: Diagnosis not present

## 2016-11-17 DIAGNOSIS — R2681 Unsteadiness on feet: Secondary | ICD-10-CM | POA: Diagnosis not present

## 2016-11-17 DIAGNOSIS — S42202D Unspecified fracture of upper end of left humerus, subsequent encounter for fracture with routine healing: Secondary | ICD-10-CM | POA: Diagnosis not present

## 2016-11-17 DIAGNOSIS — M8000XD Age-related osteoporosis with current pathological fracture, unspecified site, subsequent encounter for fracture with routine healing: Secondary | ICD-10-CM | POA: Diagnosis not present

## 2016-11-18 DIAGNOSIS — M8000XD Age-related osteoporosis with current pathological fracture, unspecified site, subsequent encounter for fracture with routine healing: Secondary | ICD-10-CM | POA: Diagnosis not present

## 2016-11-18 DIAGNOSIS — S52022D Displaced fracture of olecranon process without intraarticular extension of left ulna, subsequent encounter for closed fracture with routine healing: Secondary | ICD-10-CM | POA: Diagnosis not present

## 2016-11-18 DIAGNOSIS — S42202D Unspecified fracture of upper end of left humerus, subsequent encounter for fracture with routine healing: Secondary | ICD-10-CM | POA: Diagnosis not present

## 2016-11-18 DIAGNOSIS — G629 Polyneuropathy, unspecified: Secondary | ICD-10-CM | POA: Diagnosis not present

## 2016-11-18 DIAGNOSIS — R2681 Unsteadiness on feet: Secondary | ICD-10-CM | POA: Diagnosis not present

## 2016-11-18 DIAGNOSIS — Z4789 Encounter for other orthopedic aftercare: Secondary | ICD-10-CM | POA: Diagnosis not present

## 2016-11-19 DIAGNOSIS — R2681 Unsteadiness on feet: Secondary | ICD-10-CM | POA: Diagnosis not present

## 2016-11-19 DIAGNOSIS — G629 Polyneuropathy, unspecified: Secondary | ICD-10-CM | POA: Diagnosis not present

## 2016-11-19 DIAGNOSIS — M8000XD Age-related osteoporosis with current pathological fracture, unspecified site, subsequent encounter for fracture with routine healing: Secondary | ICD-10-CM | POA: Diagnosis not present

## 2016-11-19 DIAGNOSIS — S42202D Unspecified fracture of upper end of left humerus, subsequent encounter for fracture with routine healing: Secondary | ICD-10-CM | POA: Diagnosis not present

## 2016-11-19 DIAGNOSIS — S52022D Displaced fracture of olecranon process without intraarticular extension of left ulna, subsequent encounter for closed fracture with routine healing: Secondary | ICD-10-CM | POA: Diagnosis not present

## 2016-11-19 DIAGNOSIS — Z4789 Encounter for other orthopedic aftercare: Secondary | ICD-10-CM | POA: Diagnosis not present

## 2016-11-20 DIAGNOSIS — S42202D Unspecified fracture of upper end of left humerus, subsequent encounter for fracture with routine healing: Secondary | ICD-10-CM | POA: Diagnosis not present

## 2016-11-20 DIAGNOSIS — M8000XD Age-related osteoporosis with current pathological fracture, unspecified site, subsequent encounter for fracture with routine healing: Secondary | ICD-10-CM | POA: Diagnosis not present

## 2016-11-20 DIAGNOSIS — G629 Polyneuropathy, unspecified: Secondary | ICD-10-CM | POA: Diagnosis not present

## 2016-11-20 DIAGNOSIS — Z4789 Encounter for other orthopedic aftercare: Secondary | ICD-10-CM | POA: Diagnosis not present

## 2016-11-20 DIAGNOSIS — R2681 Unsteadiness on feet: Secondary | ICD-10-CM | POA: Diagnosis not present

## 2016-11-20 DIAGNOSIS — S52022D Displaced fracture of olecranon process without intraarticular extension of left ulna, subsequent encounter for closed fracture with routine healing: Secondary | ICD-10-CM | POA: Diagnosis not present

## 2016-11-21 DIAGNOSIS — R2681 Unsteadiness on feet: Secondary | ICD-10-CM | POA: Diagnosis not present

## 2016-11-21 DIAGNOSIS — G629 Polyneuropathy, unspecified: Secondary | ICD-10-CM | POA: Diagnosis not present

## 2016-11-21 DIAGNOSIS — M8000XD Age-related osteoporosis with current pathological fracture, unspecified site, subsequent encounter for fracture with routine healing: Secondary | ICD-10-CM | POA: Diagnosis not present

## 2016-11-21 DIAGNOSIS — S52022D Displaced fracture of olecranon process without intraarticular extension of left ulna, subsequent encounter for closed fracture with routine healing: Secondary | ICD-10-CM | POA: Diagnosis not present

## 2016-11-21 DIAGNOSIS — S42202D Unspecified fracture of upper end of left humerus, subsequent encounter for fracture with routine healing: Secondary | ICD-10-CM | POA: Diagnosis not present

## 2016-11-21 DIAGNOSIS — Z4789 Encounter for other orthopedic aftercare: Secondary | ICD-10-CM | POA: Diagnosis not present

## 2016-11-24 DIAGNOSIS — M8000XD Age-related osteoporosis with current pathological fracture, unspecified site, subsequent encounter for fracture with routine healing: Secondary | ICD-10-CM | POA: Diagnosis not present

## 2016-11-24 DIAGNOSIS — S42202D Unspecified fracture of upper end of left humerus, subsequent encounter for fracture with routine healing: Secondary | ICD-10-CM | POA: Diagnosis not present

## 2016-11-24 DIAGNOSIS — G629 Polyneuropathy, unspecified: Secondary | ICD-10-CM | POA: Diagnosis not present

## 2016-11-24 DIAGNOSIS — Z4789 Encounter for other orthopedic aftercare: Secondary | ICD-10-CM | POA: Diagnosis not present

## 2016-11-24 DIAGNOSIS — R2681 Unsteadiness on feet: Secondary | ICD-10-CM | POA: Diagnosis not present

## 2016-11-24 DIAGNOSIS — S52022D Displaced fracture of olecranon process without intraarticular extension of left ulna, subsequent encounter for closed fracture with routine healing: Secondary | ICD-10-CM | POA: Diagnosis not present

## 2016-11-25 DIAGNOSIS — S52022D Displaced fracture of olecranon process without intraarticular extension of left ulna, subsequent encounter for closed fracture with routine healing: Secondary | ICD-10-CM | POA: Diagnosis not present

## 2016-11-25 DIAGNOSIS — Z4789 Encounter for other orthopedic aftercare: Secondary | ICD-10-CM | POA: Diagnosis not present

## 2016-11-25 DIAGNOSIS — S42202D Unspecified fracture of upper end of left humerus, subsequent encounter for fracture with routine healing: Secondary | ICD-10-CM | POA: Diagnosis not present

## 2016-11-25 DIAGNOSIS — M8000XD Age-related osteoporosis with current pathological fracture, unspecified site, subsequent encounter for fracture with routine healing: Secondary | ICD-10-CM | POA: Diagnosis not present

## 2016-11-25 DIAGNOSIS — G629 Polyneuropathy, unspecified: Secondary | ICD-10-CM | POA: Diagnosis not present

## 2016-11-25 DIAGNOSIS — R2681 Unsteadiness on feet: Secondary | ICD-10-CM | POA: Diagnosis not present

## 2016-11-26 DIAGNOSIS — M8000XD Age-related osteoporosis with current pathological fracture, unspecified site, subsequent encounter for fracture with routine healing: Secondary | ICD-10-CM | POA: Diagnosis not present

## 2016-11-26 DIAGNOSIS — Z4789 Encounter for other orthopedic aftercare: Secondary | ICD-10-CM | POA: Diagnosis not present

## 2016-11-26 DIAGNOSIS — S42202D Unspecified fracture of upper end of left humerus, subsequent encounter for fracture with routine healing: Secondary | ICD-10-CM | POA: Diagnosis not present

## 2016-11-26 DIAGNOSIS — S52022D Displaced fracture of olecranon process without intraarticular extension of left ulna, subsequent encounter for closed fracture with routine healing: Secondary | ICD-10-CM | POA: Diagnosis not present

## 2016-11-26 DIAGNOSIS — R2681 Unsteadiness on feet: Secondary | ICD-10-CM | POA: Diagnosis not present

## 2016-11-26 DIAGNOSIS — G629 Polyneuropathy, unspecified: Secondary | ICD-10-CM | POA: Diagnosis not present

## 2016-11-27 DIAGNOSIS — S42202D Unspecified fracture of upper end of left humerus, subsequent encounter for fracture with routine healing: Secondary | ICD-10-CM | POA: Diagnosis not present

## 2016-11-27 DIAGNOSIS — Z4789 Encounter for other orthopedic aftercare: Secondary | ICD-10-CM | POA: Diagnosis not present

## 2016-11-27 DIAGNOSIS — M8000XD Age-related osteoporosis with current pathological fracture, unspecified site, subsequent encounter for fracture with routine healing: Secondary | ICD-10-CM | POA: Diagnosis not present

## 2016-11-27 DIAGNOSIS — R2681 Unsteadiness on feet: Secondary | ICD-10-CM | POA: Diagnosis not present

## 2016-11-27 DIAGNOSIS — G629 Polyneuropathy, unspecified: Secondary | ICD-10-CM | POA: Diagnosis not present

## 2016-11-27 DIAGNOSIS — S52022D Displaced fracture of olecranon process without intraarticular extension of left ulna, subsequent encounter for closed fracture with routine healing: Secondary | ICD-10-CM | POA: Diagnosis not present

## 2016-11-28 DIAGNOSIS — S52022D Displaced fracture of olecranon process without intraarticular extension of left ulna, subsequent encounter for closed fracture with routine healing: Secondary | ICD-10-CM | POA: Diagnosis not present

## 2016-11-28 DIAGNOSIS — S42202D Unspecified fracture of upper end of left humerus, subsequent encounter for fracture with routine healing: Secondary | ICD-10-CM | POA: Diagnosis not present

## 2016-11-28 DIAGNOSIS — G629 Polyneuropathy, unspecified: Secondary | ICD-10-CM | POA: Diagnosis not present

## 2016-11-28 DIAGNOSIS — Z4789 Encounter for other orthopedic aftercare: Secondary | ICD-10-CM | POA: Diagnosis not present

## 2016-11-28 DIAGNOSIS — R2681 Unsteadiness on feet: Secondary | ICD-10-CM | POA: Diagnosis not present

## 2016-11-28 DIAGNOSIS — M8000XD Age-related osteoporosis with current pathological fracture, unspecified site, subsequent encounter for fracture with routine healing: Secondary | ICD-10-CM | POA: Diagnosis not present

## 2016-12-01 DIAGNOSIS — R2681 Unsteadiness on feet: Secondary | ICD-10-CM | POA: Diagnosis not present

## 2016-12-01 DIAGNOSIS — S42202D Unspecified fracture of upper end of left humerus, subsequent encounter for fracture with routine healing: Secondary | ICD-10-CM | POA: Diagnosis not present

## 2016-12-01 DIAGNOSIS — G629 Polyneuropathy, unspecified: Secondary | ICD-10-CM | POA: Diagnosis not present

## 2016-12-01 DIAGNOSIS — Z4789 Encounter for other orthopedic aftercare: Secondary | ICD-10-CM | POA: Diagnosis not present

## 2016-12-01 DIAGNOSIS — M8000XD Age-related osteoporosis with current pathological fracture, unspecified site, subsequent encounter for fracture with routine healing: Secondary | ICD-10-CM | POA: Diagnosis not present

## 2016-12-01 DIAGNOSIS — S52022D Displaced fracture of olecranon process without intraarticular extension of left ulna, subsequent encounter for closed fracture with routine healing: Secondary | ICD-10-CM | POA: Diagnosis not present

## 2016-12-02 DIAGNOSIS — M8000XD Age-related osteoporosis with current pathological fracture, unspecified site, subsequent encounter for fracture with routine healing: Secondary | ICD-10-CM | POA: Diagnosis not present

## 2016-12-02 DIAGNOSIS — R2681 Unsteadiness on feet: Secondary | ICD-10-CM | POA: Diagnosis not present

## 2016-12-02 DIAGNOSIS — G629 Polyneuropathy, unspecified: Secondary | ICD-10-CM | POA: Diagnosis not present

## 2016-12-02 DIAGNOSIS — S42202D Unspecified fracture of upper end of left humerus, subsequent encounter for fracture with routine healing: Secondary | ICD-10-CM | POA: Diagnosis not present

## 2016-12-02 DIAGNOSIS — Z4789 Encounter for other orthopedic aftercare: Secondary | ICD-10-CM | POA: Diagnosis not present

## 2016-12-02 DIAGNOSIS — S52022D Displaced fracture of olecranon process without intraarticular extension of left ulna, subsequent encounter for closed fracture with routine healing: Secondary | ICD-10-CM | POA: Diagnosis not present

## 2016-12-03 DIAGNOSIS — R2681 Unsteadiness on feet: Secondary | ICD-10-CM | POA: Diagnosis not present

## 2016-12-03 DIAGNOSIS — M8000XD Age-related osteoporosis with current pathological fracture, unspecified site, subsequent encounter for fracture with routine healing: Secondary | ICD-10-CM | POA: Diagnosis not present

## 2016-12-03 DIAGNOSIS — S42202D Unspecified fracture of upper end of left humerus, subsequent encounter for fracture with routine healing: Secondary | ICD-10-CM | POA: Diagnosis not present

## 2016-12-03 DIAGNOSIS — Z4789 Encounter for other orthopedic aftercare: Secondary | ICD-10-CM | POA: Diagnosis not present

## 2016-12-03 DIAGNOSIS — G629 Polyneuropathy, unspecified: Secondary | ICD-10-CM | POA: Diagnosis not present

## 2016-12-03 DIAGNOSIS — S52022D Displaced fracture of olecranon process without intraarticular extension of left ulna, subsequent encounter for closed fracture with routine healing: Secondary | ICD-10-CM | POA: Diagnosis not present

## 2016-12-04 DIAGNOSIS — S52022D Displaced fracture of olecranon process without intraarticular extension of left ulna, subsequent encounter for closed fracture with routine healing: Secondary | ICD-10-CM | POA: Diagnosis not present

## 2016-12-04 DIAGNOSIS — S42202D Unspecified fracture of upper end of left humerus, subsequent encounter for fracture with routine healing: Secondary | ICD-10-CM | POA: Diagnosis not present

## 2016-12-04 DIAGNOSIS — M8000XD Age-related osteoporosis with current pathological fracture, unspecified site, subsequent encounter for fracture with routine healing: Secondary | ICD-10-CM | POA: Diagnosis not present

## 2016-12-04 DIAGNOSIS — R2681 Unsteadiness on feet: Secondary | ICD-10-CM | POA: Diagnosis not present

## 2016-12-04 DIAGNOSIS — G629 Polyneuropathy, unspecified: Secondary | ICD-10-CM | POA: Diagnosis not present

## 2016-12-04 DIAGNOSIS — Z4789 Encounter for other orthopedic aftercare: Secondary | ICD-10-CM | POA: Diagnosis not present

## 2016-12-05 DIAGNOSIS — M8000XD Age-related osteoporosis with current pathological fracture, unspecified site, subsequent encounter for fracture with routine healing: Secondary | ICD-10-CM | POA: Diagnosis not present

## 2016-12-05 DIAGNOSIS — S52022D Displaced fracture of olecranon process without intraarticular extension of left ulna, subsequent encounter for closed fracture with routine healing: Secondary | ICD-10-CM | POA: Diagnosis not present

## 2016-12-05 DIAGNOSIS — R2681 Unsteadiness on feet: Secondary | ICD-10-CM | POA: Diagnosis not present

## 2016-12-05 DIAGNOSIS — G629 Polyneuropathy, unspecified: Secondary | ICD-10-CM | POA: Diagnosis not present

## 2016-12-05 DIAGNOSIS — S42202D Unspecified fracture of upper end of left humerus, subsequent encounter for fracture with routine healing: Secondary | ICD-10-CM | POA: Diagnosis not present

## 2016-12-05 DIAGNOSIS — Z4789 Encounter for other orthopedic aftercare: Secondary | ICD-10-CM | POA: Diagnosis not present

## 2016-12-08 DIAGNOSIS — S52022D Displaced fracture of olecranon process without intraarticular extension of left ulna, subsequent encounter for closed fracture with routine healing: Secondary | ICD-10-CM | POA: Diagnosis not present

## 2016-12-08 DIAGNOSIS — M8000XD Age-related osteoporosis with current pathological fracture, unspecified site, subsequent encounter for fracture with routine healing: Secondary | ICD-10-CM | POA: Diagnosis not present

## 2016-12-08 DIAGNOSIS — Z4789 Encounter for other orthopedic aftercare: Secondary | ICD-10-CM | POA: Diagnosis not present

## 2016-12-08 DIAGNOSIS — S42202D Unspecified fracture of upper end of left humerus, subsequent encounter for fracture with routine healing: Secondary | ICD-10-CM | POA: Diagnosis not present

## 2016-12-08 DIAGNOSIS — G629 Polyneuropathy, unspecified: Secondary | ICD-10-CM | POA: Diagnosis not present

## 2016-12-08 DIAGNOSIS — R2681 Unsteadiness on feet: Secondary | ICD-10-CM | POA: Diagnosis not present

## 2016-12-09 DIAGNOSIS — S52022D Displaced fracture of olecranon process without intraarticular extension of left ulna, subsequent encounter for closed fracture with routine healing: Secondary | ICD-10-CM | POA: Diagnosis not present

## 2016-12-09 DIAGNOSIS — M8000XD Age-related osteoporosis with current pathological fracture, unspecified site, subsequent encounter for fracture with routine healing: Secondary | ICD-10-CM | POA: Diagnosis not present

## 2016-12-09 DIAGNOSIS — G629 Polyneuropathy, unspecified: Secondary | ICD-10-CM | POA: Diagnosis not present

## 2016-12-09 DIAGNOSIS — Z4789 Encounter for other orthopedic aftercare: Secondary | ICD-10-CM | POA: Diagnosis not present

## 2016-12-09 DIAGNOSIS — S42202D Unspecified fracture of upper end of left humerus, subsequent encounter for fracture with routine healing: Secondary | ICD-10-CM | POA: Diagnosis not present

## 2016-12-09 DIAGNOSIS — R2681 Unsteadiness on feet: Secondary | ICD-10-CM | POA: Diagnosis not present

## 2016-12-10 DIAGNOSIS — Z4789 Encounter for other orthopedic aftercare: Secondary | ICD-10-CM | POA: Diagnosis not present

## 2016-12-10 DIAGNOSIS — R2681 Unsteadiness on feet: Secondary | ICD-10-CM | POA: Diagnosis not present

## 2016-12-10 DIAGNOSIS — S42202D Unspecified fracture of upper end of left humerus, subsequent encounter for fracture with routine healing: Secondary | ICD-10-CM | POA: Diagnosis not present

## 2016-12-10 DIAGNOSIS — G629 Polyneuropathy, unspecified: Secondary | ICD-10-CM | POA: Diagnosis not present

## 2016-12-10 DIAGNOSIS — M8000XD Age-related osteoporosis with current pathological fracture, unspecified site, subsequent encounter for fracture with routine healing: Secondary | ICD-10-CM | POA: Diagnosis not present

## 2016-12-10 DIAGNOSIS — S52022D Displaced fracture of olecranon process without intraarticular extension of left ulna, subsequent encounter for closed fracture with routine healing: Secondary | ICD-10-CM | POA: Diagnosis not present

## 2016-12-11 DIAGNOSIS — G629 Polyneuropathy, unspecified: Secondary | ICD-10-CM | POA: Diagnosis not present

## 2016-12-11 DIAGNOSIS — S52022D Displaced fracture of olecranon process without intraarticular extension of left ulna, subsequent encounter for closed fracture with routine healing: Secondary | ICD-10-CM | POA: Diagnosis not present

## 2016-12-11 DIAGNOSIS — M8000XD Age-related osteoporosis with current pathological fracture, unspecified site, subsequent encounter for fracture with routine healing: Secondary | ICD-10-CM | POA: Diagnosis not present

## 2016-12-11 DIAGNOSIS — Z4789 Encounter for other orthopedic aftercare: Secondary | ICD-10-CM | POA: Diagnosis not present

## 2016-12-11 DIAGNOSIS — S42202D Unspecified fracture of upper end of left humerus, subsequent encounter for fracture with routine healing: Secondary | ICD-10-CM | POA: Diagnosis not present

## 2016-12-11 DIAGNOSIS — R2681 Unsteadiness on feet: Secondary | ICD-10-CM | POA: Diagnosis not present

## 2016-12-12 DIAGNOSIS — M8000XD Age-related osteoporosis with current pathological fracture, unspecified site, subsequent encounter for fracture with routine healing: Secondary | ICD-10-CM | POA: Diagnosis not present

## 2016-12-12 DIAGNOSIS — G629 Polyneuropathy, unspecified: Secondary | ICD-10-CM | POA: Diagnosis not present

## 2016-12-12 DIAGNOSIS — S42202D Unspecified fracture of upper end of left humerus, subsequent encounter for fracture with routine healing: Secondary | ICD-10-CM | POA: Diagnosis not present

## 2016-12-12 DIAGNOSIS — S52022D Displaced fracture of olecranon process without intraarticular extension of left ulna, subsequent encounter for closed fracture with routine healing: Secondary | ICD-10-CM | POA: Diagnosis not present

## 2016-12-12 DIAGNOSIS — Z4789 Encounter for other orthopedic aftercare: Secondary | ICD-10-CM | POA: Diagnosis not present

## 2016-12-12 DIAGNOSIS — R2681 Unsteadiness on feet: Secondary | ICD-10-CM | POA: Diagnosis not present

## 2016-12-15 DIAGNOSIS — Z4789 Encounter for other orthopedic aftercare: Secondary | ICD-10-CM | POA: Diagnosis not present

## 2016-12-15 DIAGNOSIS — G629 Polyneuropathy, unspecified: Secondary | ICD-10-CM | POA: Diagnosis not present

## 2016-12-15 DIAGNOSIS — R2681 Unsteadiness on feet: Secondary | ICD-10-CM | POA: Diagnosis not present

## 2016-12-15 DIAGNOSIS — S52022D Displaced fracture of olecranon process without intraarticular extension of left ulna, subsequent encounter for closed fracture with routine healing: Secondary | ICD-10-CM | POA: Diagnosis not present

## 2016-12-15 DIAGNOSIS — M8000XD Age-related osteoporosis with current pathological fracture, unspecified site, subsequent encounter for fracture with routine healing: Secondary | ICD-10-CM | POA: Diagnosis not present

## 2016-12-15 DIAGNOSIS — S42202D Unspecified fracture of upper end of left humerus, subsequent encounter for fracture with routine healing: Secondary | ICD-10-CM | POA: Diagnosis not present

## 2016-12-16 DIAGNOSIS — Z4789 Encounter for other orthopedic aftercare: Secondary | ICD-10-CM | POA: Diagnosis not present

## 2016-12-16 DIAGNOSIS — R2681 Unsteadiness on feet: Secondary | ICD-10-CM | POA: Diagnosis not present

## 2016-12-16 DIAGNOSIS — S42202D Unspecified fracture of upper end of left humerus, subsequent encounter for fracture with routine healing: Secondary | ICD-10-CM | POA: Diagnosis not present

## 2016-12-16 DIAGNOSIS — S52022D Displaced fracture of olecranon process without intraarticular extension of left ulna, subsequent encounter for closed fracture with routine healing: Secondary | ICD-10-CM | POA: Diagnosis not present

## 2016-12-16 DIAGNOSIS — G629 Polyneuropathy, unspecified: Secondary | ICD-10-CM | POA: Diagnosis not present

## 2016-12-16 DIAGNOSIS — M8000XD Age-related osteoporosis with current pathological fracture, unspecified site, subsequent encounter for fracture with routine healing: Secondary | ICD-10-CM | POA: Diagnosis not present

## 2016-12-17 ENCOUNTER — Other Ambulatory Visit: Payer: Self-pay | Admitting: Family Medicine

## 2016-12-18 DIAGNOSIS — S42202D Unspecified fracture of upper end of left humerus, subsequent encounter for fracture with routine healing: Secondary | ICD-10-CM | POA: Diagnosis not present

## 2016-12-18 DIAGNOSIS — S52022D Displaced fracture of olecranon process without intraarticular extension of left ulna, subsequent encounter for closed fracture with routine healing: Secondary | ICD-10-CM | POA: Diagnosis not present

## 2016-12-18 DIAGNOSIS — G629 Polyneuropathy, unspecified: Secondary | ICD-10-CM | POA: Diagnosis not present

## 2016-12-18 DIAGNOSIS — R2681 Unsteadiness on feet: Secondary | ICD-10-CM | POA: Diagnosis not present

## 2016-12-18 DIAGNOSIS — Z4789 Encounter for other orthopedic aftercare: Secondary | ICD-10-CM | POA: Diagnosis not present

## 2016-12-18 DIAGNOSIS — M8000XD Age-related osteoporosis with current pathological fracture, unspecified site, subsequent encounter for fracture with routine healing: Secondary | ICD-10-CM | POA: Diagnosis not present

## 2016-12-19 DIAGNOSIS — M8000XD Age-related osteoporosis with current pathological fracture, unspecified site, subsequent encounter for fracture with routine healing: Secondary | ICD-10-CM | POA: Diagnosis not present

## 2016-12-19 DIAGNOSIS — R2681 Unsteadiness on feet: Secondary | ICD-10-CM | POA: Diagnosis not present

## 2016-12-19 DIAGNOSIS — S42202D Unspecified fracture of upper end of left humerus, subsequent encounter for fracture with routine healing: Secondary | ICD-10-CM | POA: Diagnosis not present

## 2016-12-19 DIAGNOSIS — S52022D Displaced fracture of olecranon process without intraarticular extension of left ulna, subsequent encounter for closed fracture with routine healing: Secondary | ICD-10-CM | POA: Diagnosis not present

## 2016-12-19 DIAGNOSIS — Z4789 Encounter for other orthopedic aftercare: Secondary | ICD-10-CM | POA: Diagnosis not present

## 2016-12-19 DIAGNOSIS — G629 Polyneuropathy, unspecified: Secondary | ICD-10-CM | POA: Diagnosis not present

## 2016-12-22 DIAGNOSIS — S42202D Unspecified fracture of upper end of left humerus, subsequent encounter for fracture with routine healing: Secondary | ICD-10-CM | POA: Diagnosis not present

## 2016-12-22 DIAGNOSIS — R2681 Unsteadiness on feet: Secondary | ICD-10-CM | POA: Diagnosis not present

## 2016-12-22 DIAGNOSIS — S52022D Displaced fracture of olecranon process without intraarticular extension of left ulna, subsequent encounter for closed fracture with routine healing: Secondary | ICD-10-CM | POA: Diagnosis not present

## 2016-12-22 DIAGNOSIS — G629 Polyneuropathy, unspecified: Secondary | ICD-10-CM | POA: Diagnosis not present

## 2016-12-22 DIAGNOSIS — M8000XD Age-related osteoporosis with current pathological fracture, unspecified site, subsequent encounter for fracture with routine healing: Secondary | ICD-10-CM | POA: Diagnosis not present

## 2016-12-22 DIAGNOSIS — Z4789 Encounter for other orthopedic aftercare: Secondary | ICD-10-CM | POA: Diagnosis not present

## 2016-12-23 DIAGNOSIS — M8000XD Age-related osteoporosis with current pathological fracture, unspecified site, subsequent encounter for fracture with routine healing: Secondary | ICD-10-CM | POA: Diagnosis not present

## 2016-12-23 DIAGNOSIS — S42202D Unspecified fracture of upper end of left humerus, subsequent encounter for fracture with routine healing: Secondary | ICD-10-CM | POA: Diagnosis not present

## 2016-12-23 DIAGNOSIS — G629 Polyneuropathy, unspecified: Secondary | ICD-10-CM | POA: Diagnosis not present

## 2016-12-23 DIAGNOSIS — Z4789 Encounter for other orthopedic aftercare: Secondary | ICD-10-CM | POA: Diagnosis not present

## 2016-12-23 DIAGNOSIS — S52022D Displaced fracture of olecranon process without intraarticular extension of left ulna, subsequent encounter for closed fracture with routine healing: Secondary | ICD-10-CM | POA: Diagnosis not present

## 2016-12-23 DIAGNOSIS — R2681 Unsteadiness on feet: Secondary | ICD-10-CM | POA: Diagnosis not present

## 2016-12-25 DIAGNOSIS — M8000XD Age-related osteoporosis with current pathological fracture, unspecified site, subsequent encounter for fracture with routine healing: Secondary | ICD-10-CM | POA: Diagnosis not present

## 2016-12-25 DIAGNOSIS — R2681 Unsteadiness on feet: Secondary | ICD-10-CM | POA: Diagnosis not present

## 2016-12-25 DIAGNOSIS — S52022D Displaced fracture of olecranon process without intraarticular extension of left ulna, subsequent encounter for closed fracture with routine healing: Secondary | ICD-10-CM | POA: Diagnosis not present

## 2016-12-25 DIAGNOSIS — G629 Polyneuropathy, unspecified: Secondary | ICD-10-CM | POA: Diagnosis not present

## 2016-12-25 DIAGNOSIS — S42202D Unspecified fracture of upper end of left humerus, subsequent encounter for fracture with routine healing: Secondary | ICD-10-CM | POA: Diagnosis not present

## 2016-12-25 DIAGNOSIS — Z4789 Encounter for other orthopedic aftercare: Secondary | ICD-10-CM | POA: Diagnosis not present

## 2016-12-25 DIAGNOSIS — S42295D Other nondisplaced fracture of upper end of left humerus, subsequent encounter for fracture with routine healing: Secondary | ICD-10-CM | POA: Diagnosis not present

## 2017-01-03 DIAGNOSIS — Z23 Encounter for immunization: Secondary | ICD-10-CM | POA: Diagnosis not present

## 2017-01-11 ENCOUNTER — Other Ambulatory Visit: Payer: Self-pay | Admitting: Family Medicine

## 2017-01-13 DIAGNOSIS — I7389 Other specified peripheral vascular diseases: Secondary | ICD-10-CM | POA: Diagnosis not present

## 2017-01-13 DIAGNOSIS — S93422A Sprain of deltoid ligament of left ankle, initial encounter: Secondary | ICD-10-CM | POA: Diagnosis not present

## 2017-01-13 DIAGNOSIS — B351 Tinea unguium: Secondary | ICD-10-CM | POA: Diagnosis not present

## 2017-02-16 DIAGNOSIS — G629 Polyneuropathy, unspecified: Secondary | ICD-10-CM | POA: Diagnosis not present

## 2017-02-16 DIAGNOSIS — K148 Other diseases of tongue: Secondary | ICD-10-CM | POA: Diagnosis not present

## 2017-02-16 DIAGNOSIS — I10 Essential (primary) hypertension: Secondary | ICD-10-CM | POA: Diagnosis not present

## 2017-02-16 DIAGNOSIS — R609 Edema, unspecified: Secondary | ICD-10-CM | POA: Diagnosis not present

## 2017-02-16 DIAGNOSIS — E039 Hypothyroidism, unspecified: Secondary | ICD-10-CM | POA: Diagnosis not present

## 2017-02-16 DIAGNOSIS — I5022 Chronic systolic (congestive) heart failure: Secondary | ICD-10-CM | POA: Diagnosis not present

## 2017-02-16 DIAGNOSIS — I482 Chronic atrial fibrillation: Secondary | ICD-10-CM | POA: Diagnosis not present

## 2017-04-01 ENCOUNTER — Other Ambulatory Visit: Payer: Self-pay | Admitting: Family Medicine

## 2017-05-02 ENCOUNTER — Other Ambulatory Visit: Payer: Self-pay | Admitting: Family Medicine

## 2017-08-19 DIAGNOSIS — R609 Edema, unspecified: Secondary | ICD-10-CM | POA: Diagnosis not present

## 2017-08-19 DIAGNOSIS — I482 Chronic atrial fibrillation: Secondary | ICD-10-CM | POA: Diagnosis not present

## 2017-08-19 DIAGNOSIS — G629 Polyneuropathy, unspecified: Secondary | ICD-10-CM | POA: Diagnosis not present

## 2017-08-19 DIAGNOSIS — I10 Essential (primary) hypertension: Secondary | ICD-10-CM | POA: Diagnosis not present

## 2017-08-19 DIAGNOSIS — E039 Hypothyroidism, unspecified: Secondary | ICD-10-CM | POA: Diagnosis not present

## 2017-08-19 DIAGNOSIS — J300 Vasomotor rhinitis: Secondary | ICD-10-CM | POA: Diagnosis not present

## 2017-09-21 DIAGNOSIS — H353134 Nonexudative age-related macular degeneration, bilateral, advanced atrophic with subfoveal involvement: Secondary | ICD-10-CM | POA: Diagnosis not present

## 2017-09-21 DIAGNOSIS — H524 Presbyopia: Secondary | ICD-10-CM | POA: Diagnosis not present

## 2017-09-21 DIAGNOSIS — H5201 Hypermetropia, right eye: Secondary | ICD-10-CM | POA: Diagnosis not present

## 2017-09-25 DIAGNOSIS — N183 Chronic kidney disease, stage 3 (moderate): Secondary | ICD-10-CM | POA: Diagnosis not present

## 2017-09-25 DIAGNOSIS — I482 Chronic atrial fibrillation: Secondary | ICD-10-CM | POA: Diagnosis not present

## 2017-09-25 DIAGNOSIS — R609 Edema, unspecified: Secondary | ICD-10-CM | POA: Diagnosis not present

## 2017-09-25 DIAGNOSIS — Z7901 Long term (current) use of anticoagulants: Secondary | ICD-10-CM | POA: Diagnosis not present

## 2017-09-25 DIAGNOSIS — D0462 Carcinoma in situ of skin of left upper limb, including shoulder: Secondary | ICD-10-CM | POA: Diagnosis not present

## 2017-09-25 DIAGNOSIS — D0461 Carcinoma in situ of skin of right upper limb, including shoulder: Secondary | ICD-10-CM | POA: Diagnosis not present

## 2017-09-25 DIAGNOSIS — I10 Essential (primary) hypertension: Secondary | ICD-10-CM | POA: Diagnosis not present

## 2017-10-08 DIAGNOSIS — Z9989 Dependence on other enabling machines and devices: Secondary | ICD-10-CM | POA: Diagnosis not present

## 2017-10-08 DIAGNOSIS — L821 Other seborrheic keratosis: Secondary | ICD-10-CM | POA: Diagnosis not present

## 2017-10-08 DIAGNOSIS — C44622 Squamous cell carcinoma of skin of right upper limb, including shoulder: Secondary | ICD-10-CM | POA: Diagnosis not present

## 2017-10-08 DIAGNOSIS — Z7901 Long term (current) use of anticoagulants: Secondary | ICD-10-CM | POA: Diagnosis not present

## 2017-10-08 DIAGNOSIS — I10 Essential (primary) hypertension: Secondary | ICD-10-CM | POA: Diagnosis not present

## 2017-10-08 DIAGNOSIS — L578 Other skin changes due to chronic exposure to nonionizing radiation: Secondary | ICD-10-CM | POA: Diagnosis not present

## 2017-10-08 DIAGNOSIS — R2681 Unsteadiness on feet: Secondary | ICD-10-CM | POA: Diagnosis not present

## 2017-10-08 DIAGNOSIS — G629 Polyneuropathy, unspecified: Secondary | ICD-10-CM | POA: Diagnosis not present

## 2017-10-08 DIAGNOSIS — R609 Edema, unspecified: Secondary | ICD-10-CM | POA: Diagnosis not present

## 2017-10-08 DIAGNOSIS — C44629 Squamous cell carcinoma of skin of left upper limb, including shoulder: Secondary | ICD-10-CM | POA: Diagnosis not present

## 2017-10-08 DIAGNOSIS — Z993 Dependence on wheelchair: Secondary | ICD-10-CM | POA: Diagnosis not present

## 2017-10-08 DIAGNOSIS — L570 Actinic keratosis: Secondary | ICD-10-CM | POA: Diagnosis not present

## 2017-10-23 DIAGNOSIS — L989 Disorder of the skin and subcutaneous tissue, unspecified: Secondary | ICD-10-CM | POA: Diagnosis not present

## 2017-10-23 DIAGNOSIS — R0602 Shortness of breath: Secondary | ICD-10-CM | POA: Diagnosis not present

## 2017-10-23 DIAGNOSIS — M7989 Other specified soft tissue disorders: Secondary | ICD-10-CM | POA: Diagnosis not present

## 2017-11-16 DIAGNOSIS — Z23 Encounter for immunization: Secondary | ICD-10-CM | POA: Diagnosis not present

## 2017-12-18 IMAGING — US US EXTREM LOW VENOUS BILAT
1 series · 13 of 24 positions shown · non-contrast
Comparison: None.

CLINICAL DATA: Patient with bilateral leg swelling and intermittent
leg pain for 2 weeks.



[Series 1: us extrem low venous bilat · 0.06mm/px · 13 of 50 slices shown]
[im 1/50]
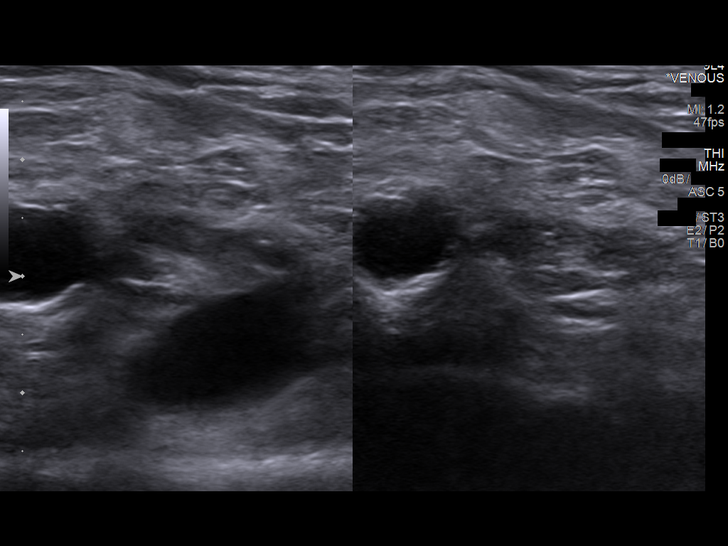
[im 5/50]
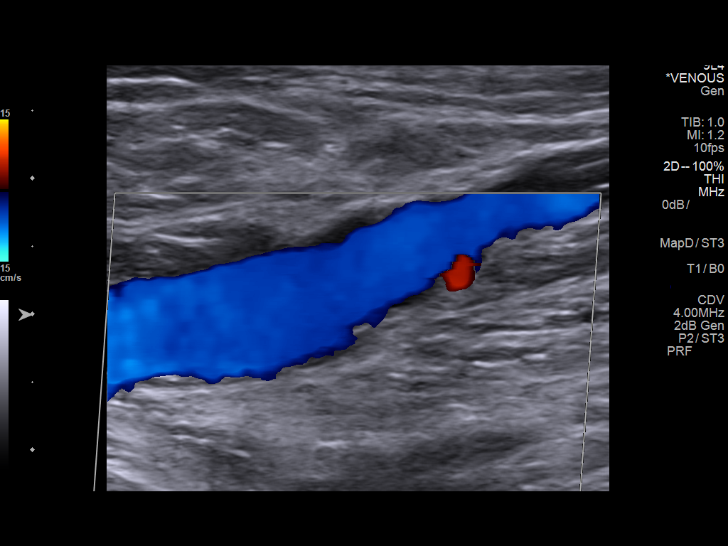
[im 9/50]
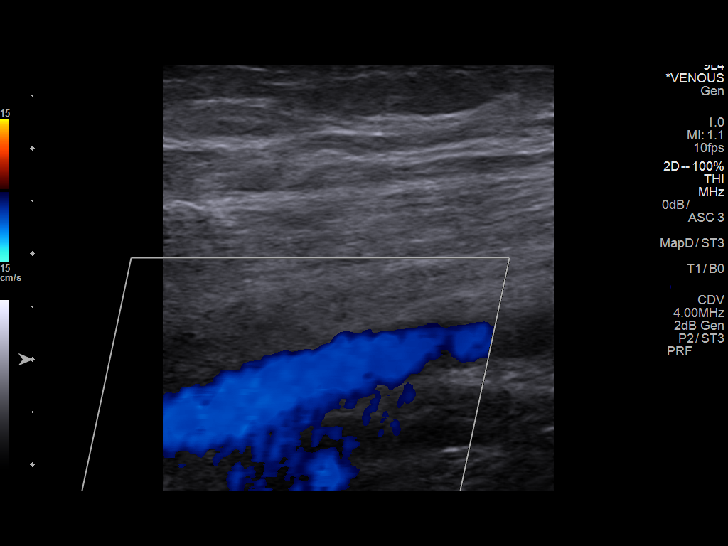
[im 13/50]
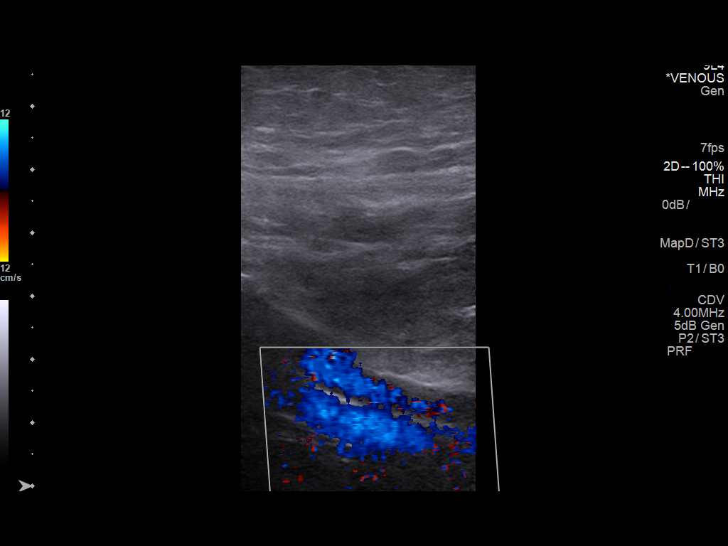
[im 18/50]
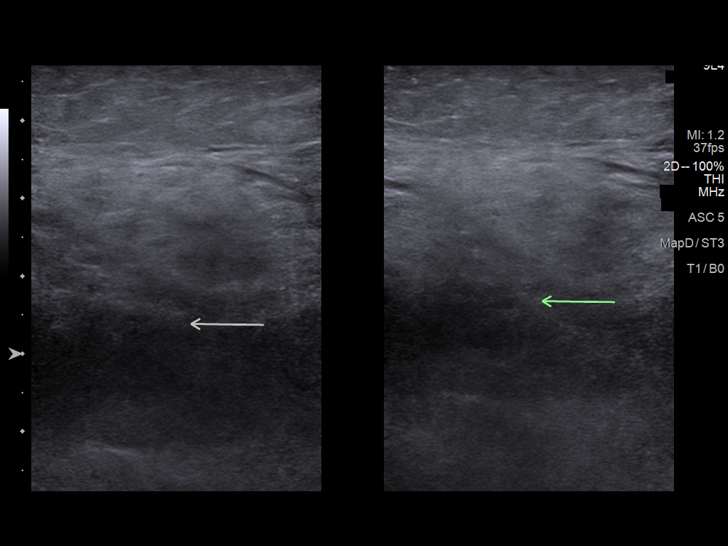
[im 22/50]
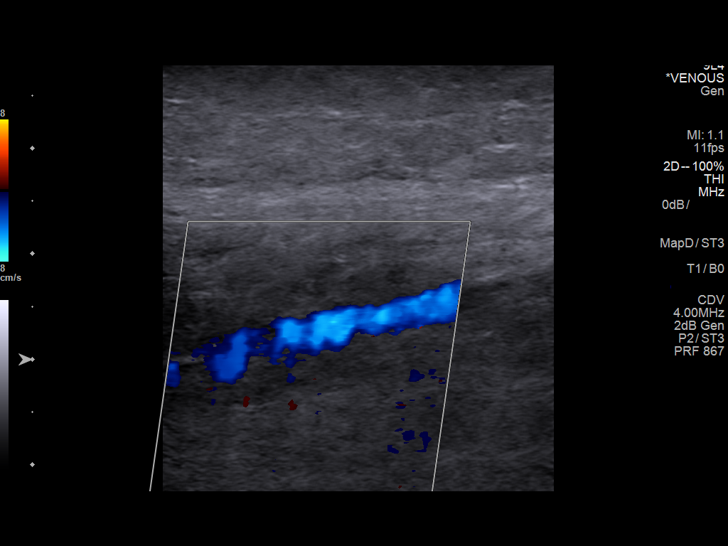
[im 26/50]
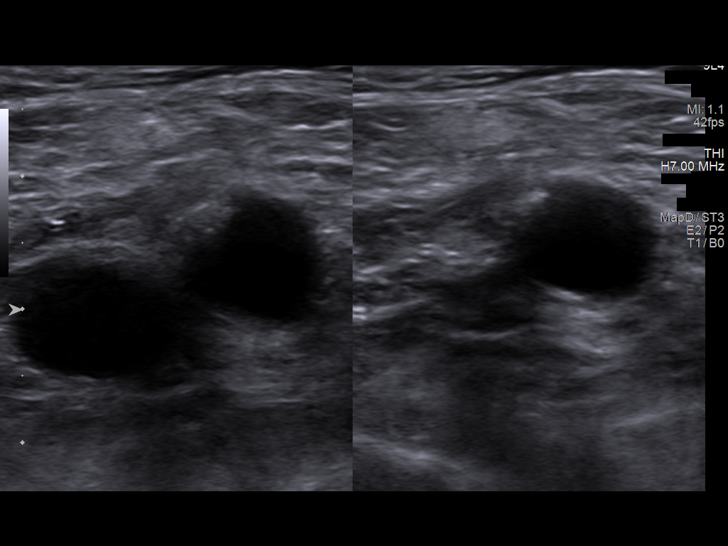
[im 28/50]
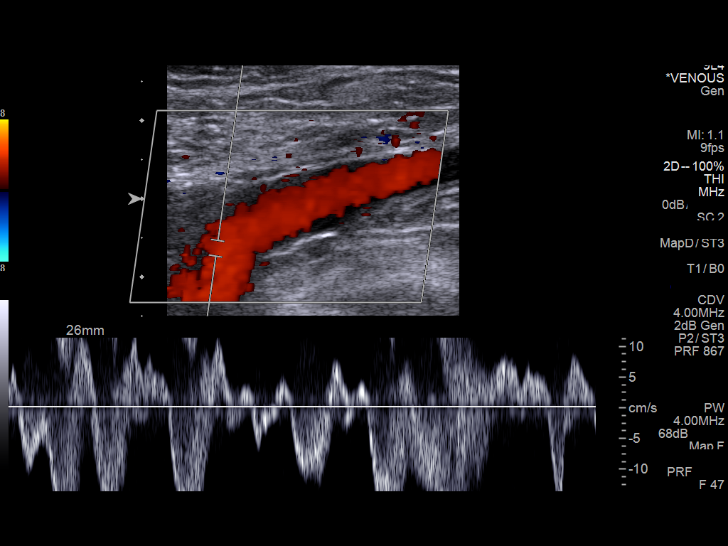
[im 32/50]
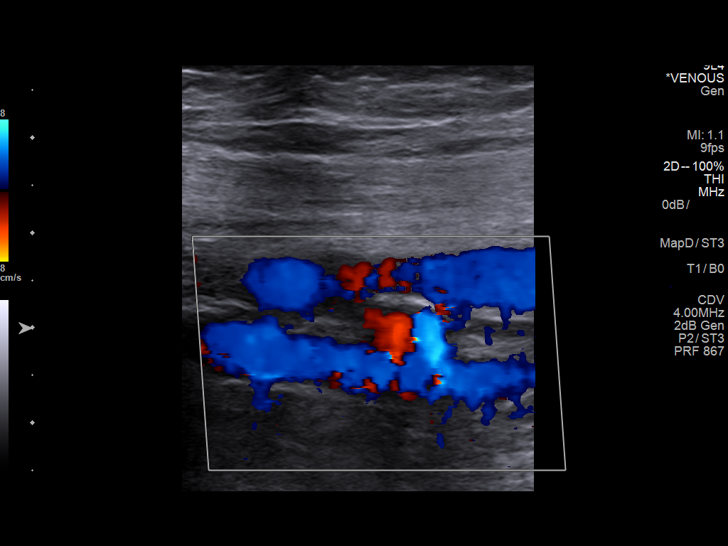
[im 37/50]
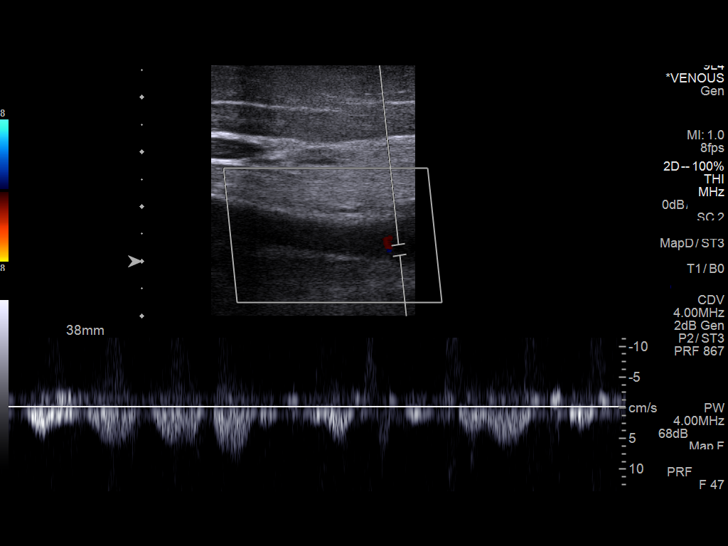
[im 41/50]
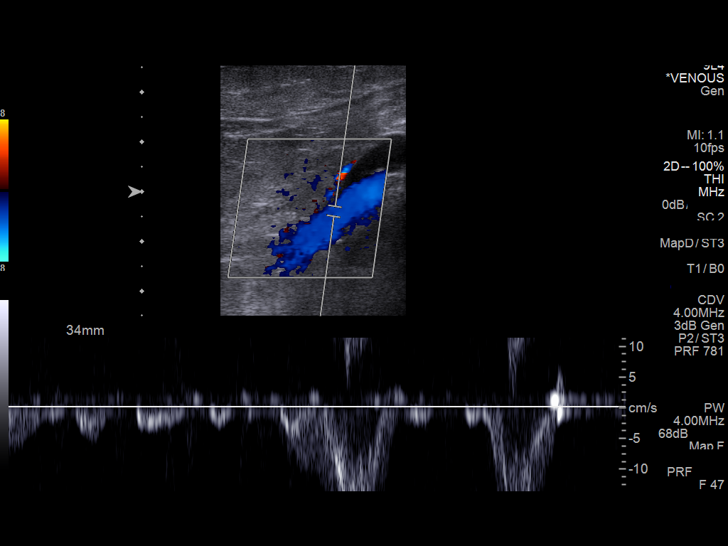
[im 45/50]
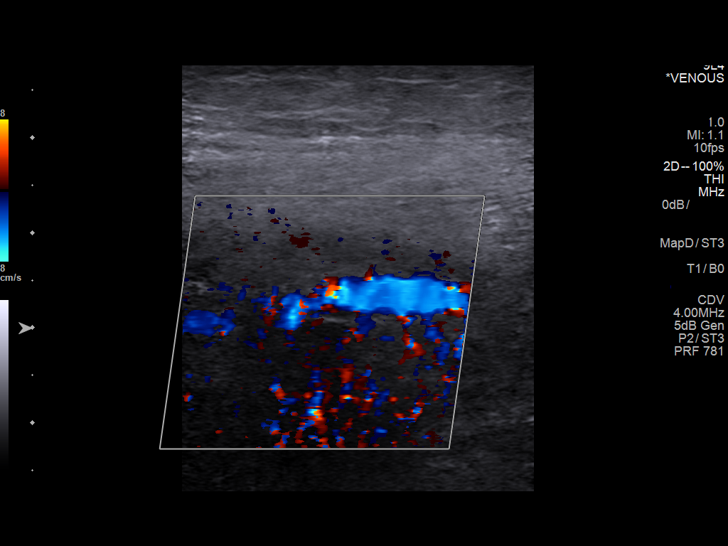
[im 50/50]
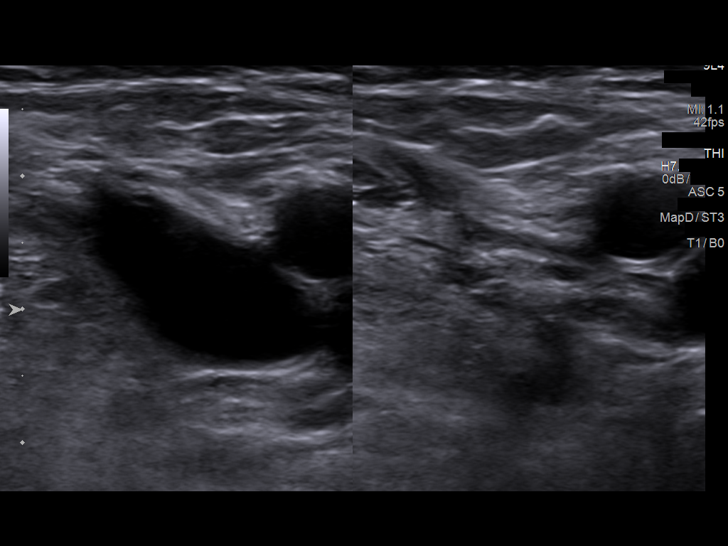

[13 of 24 positions shown; findings below may reference images not displayed]

FINDINGS: RIGHT LOWER EXTREMITY

Common Femoral Vein: No evidence of thrombus. Normal
compressibility, respiratory phasicity and response to augmentation.

Saphenofemoral Junction: No evidence of thrombus. Normal
compressibility and flow on color Doppler imaging.

Profunda Femoral Vein: No evidence of thrombus. Normal
compressibility and flow on color Doppler imaging.

Femoral Vein: No evidence of thrombus. Normal compressibility,
respiratory phasicity and response to augmentation.

Popliteal Vein: No evidence of thrombus. Normal compressibility,
respiratory phasicity and response to augmentation.

Calf Veins: No evidence of thrombus. Normal compressibility and flow
on color Doppler imaging.

Superficial Great Saphenous Vein: No evidence of thrombus. Normal
compressibility and flow on color Doppler imaging.

Venous Reflux:  None.

Other Findings:  None.

LEFT LOWER EXTREMITY

Common Femoral Vein: No evidence of thrombus. Normal
compressibility, respiratory phasicity and response to augmentation.

Saphenofemoral Junction: No evidence of thrombus. Normal
compressibility and flow on color Doppler imaging.

Profunda Femoral Vein: No evidence of thrombus. Normal
compressibility and flow on color Doppler imaging.

Femoral Vein: No evidence of thrombus. Normal compressibility,
respiratory phasicity and response to augmentation.

Popliteal Vein: No evidence of thrombus. Normal compressibility,
respiratory phasicity and response to augmentation.

Calf Veins: No evidence of thrombus. Normal compressibility and flow
on color Doppler imaging.

Superficial Great Saphenous Vein: No evidence of thrombus. Normal
compressibility and flow on color Doppler imaging.

Venous Reflux:  None.

Other Findings:  None.
IMPRESSION: No evidence of deep venous thrombosis.

## 2018-02-27 DIAGNOSIS — R7989 Other specified abnormal findings of blood chemistry: Secondary | ICD-10-CM | POA: Diagnosis not present

## 2018-02-27 DIAGNOSIS — J069 Acute upper respiratory infection, unspecified: Secondary | ICD-10-CM | POA: Diagnosis not present

## 2018-02-27 DIAGNOSIS — I5023 Acute on chronic systolic (congestive) heart failure: Secondary | ICD-10-CM | POA: Diagnosis not present

## 2018-02-27 DIAGNOSIS — N183 Chronic kidney disease, stage 3 (moderate): Secondary | ICD-10-CM | POA: Diagnosis not present

## 2018-02-27 DIAGNOSIS — R0902 Hypoxemia: Secondary | ICD-10-CM | POA: Diagnosis not present

## 2018-02-27 DIAGNOSIS — I4891 Unspecified atrial fibrillation: Secondary | ICD-10-CM | POA: Diagnosis not present

## 2018-02-27 DIAGNOSIS — S81811A Laceration without foreign body, right lower leg, initial encounter: Secondary | ICD-10-CM | POA: Diagnosis not present

## 2018-02-27 DIAGNOSIS — I130 Hypertensive heart and chronic kidney disease with heart failure and stage 1 through stage 4 chronic kidney disease, or unspecified chronic kidney disease: Secondary | ICD-10-CM | POA: Diagnosis not present

## 2018-02-27 DIAGNOSIS — J209 Acute bronchitis, unspecified: Secondary | ICD-10-CM | POA: Diagnosis not present

## 2018-02-27 DIAGNOSIS — J189 Pneumonia, unspecified organism: Secondary | ICD-10-CM | POA: Diagnosis not present

## 2018-02-27 DIAGNOSIS — I482 Chronic atrial fibrillation, unspecified: Secondary | ICD-10-CM | POA: Diagnosis not present

## 2018-02-27 DIAGNOSIS — R0602 Shortness of breath: Secondary | ICD-10-CM | POA: Diagnosis not present

## 2018-02-28 DIAGNOSIS — J069 Acute upper respiratory infection, unspecified: Secondary | ICD-10-CM | POA: Diagnosis not present

## 2018-02-28 DIAGNOSIS — R05 Cough: Secondary | ICD-10-CM | POA: Diagnosis not present

## 2018-02-28 DIAGNOSIS — R0902 Hypoxemia: Secondary | ICD-10-CM | POA: Diagnosis present

## 2018-02-28 DIAGNOSIS — G629 Polyneuropathy, unspecified: Secondary | ICD-10-CM | POA: Diagnosis present

## 2018-02-28 DIAGNOSIS — S81811A Laceration without foreign body, right lower leg, initial encounter: Secondary | ICD-10-CM | POA: Diagnosis present

## 2018-02-28 DIAGNOSIS — I5031 Acute diastolic (congestive) heart failure: Secondary | ICD-10-CM | POA: Diagnosis not present

## 2018-02-28 DIAGNOSIS — J8410 Pulmonary fibrosis, unspecified: Secondary | ICD-10-CM | POA: Diagnosis not present

## 2018-02-28 DIAGNOSIS — Z66 Do not resuscitate: Secondary | ICD-10-CM | POA: Diagnosis present

## 2018-02-28 DIAGNOSIS — I482 Chronic atrial fibrillation, unspecified: Secondary | ICD-10-CM | POA: Diagnosis not present

## 2018-02-28 DIAGNOSIS — Z7901 Long term (current) use of anticoagulants: Secondary | ICD-10-CM | POA: Diagnosis not present

## 2018-02-28 DIAGNOSIS — E039 Hypothyroidism, unspecified: Secondary | ICD-10-CM | POA: Diagnosis not present

## 2018-02-28 DIAGNOSIS — K449 Diaphragmatic hernia without obstruction or gangrene: Secondary | ICD-10-CM | POA: Diagnosis not present

## 2018-02-28 DIAGNOSIS — I5023 Acute on chronic systolic (congestive) heart failure: Secondary | ICD-10-CM | POA: Diagnosis present

## 2018-02-28 DIAGNOSIS — N183 Chronic kidney disease, stage 3 (moderate): Secondary | ICD-10-CM | POA: Diagnosis not present

## 2018-02-28 DIAGNOSIS — I130 Hypertensive heart and chronic kidney disease with heart failure and stage 1 through stage 4 chronic kidney disease, or unspecified chronic kidney disease: Secondary | ICD-10-CM | POA: Diagnosis present

## 2018-02-28 DIAGNOSIS — Z85828 Personal history of other malignant neoplasm of skin: Secondary | ICD-10-CM | POA: Diagnosis not present

## 2018-02-28 DIAGNOSIS — R918 Other nonspecific abnormal finding of lung field: Secondary | ICD-10-CM | POA: Diagnosis not present

## 2018-02-28 DIAGNOSIS — J209 Acute bronchitis, unspecified: Secondary | ICD-10-CM | POA: Diagnosis present

## 2018-02-28 DIAGNOSIS — R7989 Other specified abnormal findings of blood chemistry: Secondary | ICD-10-CM | POA: Diagnosis present

## 2018-02-28 DIAGNOSIS — J189 Pneumonia, unspecified organism: Secondary | ICD-10-CM | POA: Diagnosis present

## 2018-03-05 DIAGNOSIS — J189 Pneumonia, unspecified organism: Secondary | ICD-10-CM | POA: Diagnosis not present

## 2018-03-05 DIAGNOSIS — S81811D Laceration without foreign body, right lower leg, subsequent encounter: Secondary | ICD-10-CM | POA: Diagnosis not present

## 2018-03-05 DIAGNOSIS — E039 Hypothyroidism, unspecified: Secondary | ICD-10-CM | POA: Diagnosis not present

## 2018-03-05 DIAGNOSIS — G629 Polyneuropathy, unspecified: Secondary | ICD-10-CM | POA: Diagnosis not present

## 2018-03-05 DIAGNOSIS — Z9181 History of falling: Secondary | ICD-10-CM | POA: Diagnosis not present

## 2018-03-05 DIAGNOSIS — I482 Chronic atrial fibrillation, unspecified: Secondary | ICD-10-CM | POA: Diagnosis not present

## 2018-03-05 DIAGNOSIS — N183 Chronic kidney disease, stage 3 (moderate): Secondary | ICD-10-CM | POA: Diagnosis not present

## 2018-03-05 DIAGNOSIS — M810 Age-related osteoporosis without current pathological fracture: Secondary | ICD-10-CM | POA: Diagnosis not present

## 2018-03-05 DIAGNOSIS — I5022 Chronic systolic (congestive) heart failure: Secondary | ICD-10-CM | POA: Diagnosis not present

## 2018-03-10 DIAGNOSIS — S81811D Laceration without foreign body, right lower leg, subsequent encounter: Secondary | ICD-10-CM | POA: Diagnosis not present

## 2018-03-10 DIAGNOSIS — R0902 Hypoxemia: Secondary | ICD-10-CM | POA: Diagnosis not present

## 2018-03-10 DIAGNOSIS — J181 Lobar pneumonia, unspecified organism: Secondary | ICD-10-CM | POA: Diagnosis not present

## 2018-03-10 DIAGNOSIS — J069 Acute upper respiratory infection, unspecified: Secondary | ICD-10-CM | POA: Diagnosis not present

## 2018-03-10 DIAGNOSIS — R609 Edema, unspecified: Secondary | ICD-10-CM | POA: Diagnosis not present

## 2018-03-10 DIAGNOSIS — I5022 Chronic systolic (congestive) heart failure: Secondary | ICD-10-CM | POA: Diagnosis not present

## 2018-03-10 DIAGNOSIS — R062 Wheezing: Secondary | ICD-10-CM | POA: Diagnosis not present

## 2018-03-10 DIAGNOSIS — Z09 Encounter for follow-up examination after completed treatment for conditions other than malignant neoplasm: Secondary | ICD-10-CM | POA: Diagnosis not present

## 2018-03-10 DIAGNOSIS — I482 Chronic atrial fibrillation, unspecified: Secondary | ICD-10-CM | POA: Diagnosis not present

## 2018-03-10 DIAGNOSIS — N183 Chronic kidney disease, stage 3 (moderate): Secondary | ICD-10-CM | POA: Diagnosis not present

## 2018-03-10 DIAGNOSIS — I5033 Acute on chronic diastolic (congestive) heart failure: Secondary | ICD-10-CM | POA: Diagnosis not present

## 2018-03-10 DIAGNOSIS — Z7901 Long term (current) use of anticoagulants: Secondary | ICD-10-CM | POA: Diagnosis not present

## 2018-03-10 DIAGNOSIS — R2681 Unsteadiness on feet: Secondary | ICD-10-CM | POA: Diagnosis not present

## 2018-03-15 DIAGNOSIS — S81811D Laceration without foreign body, right lower leg, subsequent encounter: Secondary | ICD-10-CM | POA: Diagnosis not present

## 2018-03-15 DIAGNOSIS — N183 Chronic kidney disease, stage 3 (moderate): Secondary | ICD-10-CM | POA: Diagnosis not present

## 2018-03-24 DIAGNOSIS — W19XXXA Unspecified fall, initial encounter: Secondary | ICD-10-CM | POA: Diagnosis not present

## 2018-03-24 DIAGNOSIS — R51 Headache: Secondary | ICD-10-CM | POA: Diagnosis not present

## 2018-03-24 DIAGNOSIS — R4189 Other symptoms and signs involving cognitive functions and awareness: Secondary | ICD-10-CM | POA: Diagnosis not present

## 2018-03-24 DIAGNOSIS — Z96641 Presence of right artificial hip joint: Secondary | ICD-10-CM | POA: Diagnosis not present

## 2018-03-24 DIAGNOSIS — Z96642 Presence of left artificial hip joint: Secondary | ICD-10-CM | POA: Diagnosis not present

## 2018-03-24 DIAGNOSIS — R0602 Shortness of breath: Secondary | ICD-10-CM | POA: Diagnosis not present

## 2018-03-24 DIAGNOSIS — G629 Polyneuropathy, unspecified: Secondary | ICD-10-CM | POA: Diagnosis not present

## 2018-03-24 DIAGNOSIS — A0811 Acute gastroenteropathy due to Norwalk agent: Secondary | ICD-10-CM | POA: Diagnosis not present

## 2018-03-24 DIAGNOSIS — M47812 Spondylosis without myelopathy or radiculopathy, cervical region: Secondary | ICD-10-CM | POA: Diagnosis not present

## 2018-03-24 DIAGNOSIS — Z7901 Long term (current) use of anticoagulants: Secondary | ICD-10-CM | POA: Diagnosis not present

## 2018-03-24 DIAGNOSIS — R1110 Vomiting, unspecified: Secondary | ICD-10-CM | POA: Diagnosis not present

## 2018-03-24 DIAGNOSIS — R0989 Other specified symptoms and signs involving the circulatory and respiratory systems: Secondary | ICD-10-CM | POA: Diagnosis not present

## 2018-03-24 DIAGNOSIS — R9082 White matter disease, unspecified: Secondary | ICD-10-CM | POA: Diagnosis not present

## 2018-03-24 DIAGNOSIS — S06370A Contusion, laceration, and hemorrhage of cerebellum without loss of consciousness, initial encounter: Secondary | ICD-10-CM | POA: Diagnosis not present

## 2018-03-24 DIAGNOSIS — I5022 Chronic systolic (congestive) heart failure: Secondary | ICD-10-CM | POA: Diagnosis not present

## 2018-03-24 DIAGNOSIS — Z66 Do not resuscitate: Secondary | ICD-10-CM | POA: Diagnosis not present

## 2018-03-24 DIAGNOSIS — I4891 Unspecified atrial fibrillation: Secondary | ICD-10-CM | POA: Diagnosis not present

## 2018-03-24 DIAGNOSIS — M549 Dorsalgia, unspecified: Secondary | ICD-10-CM | POA: Diagnosis not present

## 2018-03-24 DIAGNOSIS — I482 Chronic atrial fibrillation, unspecified: Secondary | ICD-10-CM | POA: Diagnosis not present

## 2018-03-24 DIAGNOSIS — R9089 Other abnormal findings on diagnostic imaging of central nervous system: Secondary | ICD-10-CM | POA: Diagnosis not present

## 2018-03-24 DIAGNOSIS — R112 Nausea with vomiting, unspecified: Secondary | ICD-10-CM | POA: Diagnosis not present

## 2018-03-24 DIAGNOSIS — I614 Nontraumatic intracerebral hemorrhage in cerebellum: Secondary | ICD-10-CM | POA: Diagnosis not present

## 2018-03-24 DIAGNOSIS — R930 Abnormal findings on diagnostic imaging of skull and head, not elsewhere classified: Secondary | ICD-10-CM | POA: Diagnosis not present

## 2018-03-24 DIAGNOSIS — E039 Hypothyroidism, unspecified: Secondary | ICD-10-CM | POA: Diagnosis not present

## 2018-03-24 DIAGNOSIS — N183 Chronic kidney disease, stage 3 (moderate): Secondary | ICD-10-CM | POA: Diagnosis not present

## 2018-03-24 DIAGNOSIS — Z85828 Personal history of other malignant neoplasm of skin: Secondary | ICD-10-CM | POA: Diagnosis not present

## 2018-03-24 DIAGNOSIS — I879 Disorder of vein, unspecified: Secondary | ICD-10-CM | POA: Diagnosis not present

## 2018-03-24 DIAGNOSIS — M1612 Unilateral primary osteoarthritis, left hip: Secondary | ICD-10-CM | POA: Diagnosis not present

## 2018-03-24 DIAGNOSIS — M810 Age-related osteoporosis without current pathological fracture: Secondary | ICD-10-CM | POA: Diagnosis not present

## 2018-03-24 DIAGNOSIS — R197 Diarrhea, unspecified: Secondary | ICD-10-CM | POA: Diagnosis not present

## 2018-03-25 DIAGNOSIS — Z09 Encounter for follow-up examination after completed treatment for conditions other than malignant neoplasm: Secondary | ICD-10-CM | POA: Diagnosis not present

## 2018-03-25 DIAGNOSIS — I5022 Chronic systolic (congestive) heart failure: Secondary | ICD-10-CM | POA: Diagnosis not present

## 2018-03-25 DIAGNOSIS — M6281 Muscle weakness (generalized): Secondary | ICD-10-CM | POA: Diagnosis not present

## 2018-03-25 DIAGNOSIS — N183 Chronic kidney disease, stage 3 (moderate): Secondary | ICD-10-CM | POA: Diagnosis not present

## 2018-03-25 DIAGNOSIS — E039 Hypothyroidism, unspecified: Secondary | ICD-10-CM | POA: Diagnosis not present

## 2018-03-25 DIAGNOSIS — R609 Edema, unspecified: Secondary | ICD-10-CM | POA: Diagnosis not present

## 2018-03-25 DIAGNOSIS — I480 Paroxysmal atrial fibrillation: Secondary | ICD-10-CM | POA: Diagnosis not present

## 2018-03-25 DIAGNOSIS — R600 Localized edema: Secondary | ICD-10-CM | POA: Diagnosis not present

## 2018-03-25 DIAGNOSIS — J300 Vasomotor rhinitis: Secondary | ICD-10-CM | POA: Diagnosis not present

## 2018-03-25 DIAGNOSIS — I482 Chronic atrial fibrillation, unspecified: Secondary | ICD-10-CM | POA: Diagnosis not present

## 2018-03-25 DIAGNOSIS — S06370A Contusion, laceration, and hemorrhage of cerebellum without loss of consciousness, initial encounter: Secondary | ICD-10-CM | POA: Diagnosis not present

## 2018-03-25 DIAGNOSIS — M810 Age-related osteoporosis without current pathological fracture: Secondary | ICD-10-CM | POA: Diagnosis not present

## 2018-03-25 DIAGNOSIS — R488 Other symbolic dysfunctions: Secondary | ICD-10-CM | POA: Diagnosis not present

## 2018-03-25 DIAGNOSIS — A0811 Acute gastroenteropathy due to Norwalk agent: Secondary | ICD-10-CM | POA: Diagnosis not present

## 2018-03-25 DIAGNOSIS — R2681 Unsteadiness on feet: Secondary | ICD-10-CM | POA: Diagnosis not present

## 2018-03-25 DIAGNOSIS — R1110 Vomiting, unspecified: Secondary | ICD-10-CM | POA: Diagnosis not present

## 2018-03-25 DIAGNOSIS — S06360D Traumatic hemorrhage of cerebrum, unspecified, without loss of consciousness, subsequent encounter: Secondary | ICD-10-CM | POA: Diagnosis not present

## 2018-03-25 DIAGNOSIS — G319 Degenerative disease of nervous system, unspecified: Secondary | ICD-10-CM | POA: Diagnosis not present

## 2018-03-25 DIAGNOSIS — R296 Repeated falls: Secondary | ICD-10-CM | POA: Diagnosis not present

## 2018-03-25 DIAGNOSIS — G629 Polyneuropathy, unspecified: Secondary | ICD-10-CM | POA: Diagnosis not present

## 2018-03-25 DIAGNOSIS — R4189 Other symptoms and signs involving cognitive functions and awareness: Secondary | ICD-10-CM | POA: Diagnosis not present

## 2018-03-25 DIAGNOSIS — S81811D Laceration without foreign body, right lower leg, subsequent encounter: Secondary | ICD-10-CM | POA: Diagnosis not present

## 2018-03-25 DIAGNOSIS — Z7901 Long term (current) use of anticoagulants: Secondary | ICD-10-CM | POA: Diagnosis not present

## 2018-03-25 DIAGNOSIS — R197 Diarrhea, unspecified: Secondary | ICD-10-CM | POA: Diagnosis not present

## 2018-03-25 DIAGNOSIS — Z66 Do not resuscitate: Secondary | ICD-10-CM | POA: Diagnosis not present

## 2018-03-25 DIAGNOSIS — I614 Nontraumatic intracerebral hemorrhage in cerebellum: Secondary | ICD-10-CM | POA: Diagnosis not present

## 2018-03-25 DIAGNOSIS — Z23 Encounter for immunization: Secondary | ICD-10-CM | POA: Diagnosis not present

## 2018-03-31 DIAGNOSIS — R609 Edema, unspecified: Secondary | ICD-10-CM | POA: Diagnosis not present

## 2018-03-31 DIAGNOSIS — S81811D Laceration without foreign body, right lower leg, subsequent encounter: Secondary | ICD-10-CM | POA: Diagnosis not present

## 2018-03-31 DIAGNOSIS — N183 Chronic kidney disease, stage 3 (moderate): Secondary | ICD-10-CM | POA: Diagnosis not present

## 2018-03-31 DIAGNOSIS — Z09 Encounter for follow-up examination after completed treatment for conditions other than malignant neoplasm: Secondary | ICD-10-CM | POA: Diagnosis not present

## 2018-03-31 DIAGNOSIS — I614 Nontraumatic intracerebral hemorrhage in cerebellum: Secondary | ICD-10-CM | POA: Diagnosis not present

## 2021-12-11 DEATH — deceased
